# Patient Record
Sex: Male | Born: 1965 | Race: Black or African American | Hispanic: No | Marital: Single | Smoking: Never smoker
Health system: Southern US, Community
[De-identification: ages and names within clinical notes are randomized; demographics above are authoritative.]

## PROBLEM LIST (undated history)

## (undated) DIAGNOSIS — I2510 Atherosclerotic heart disease of native coronary artery without angina pectoris: Secondary | ICD-10-CM

## (undated) DIAGNOSIS — I10 Essential (primary) hypertension: Secondary | ICD-10-CM

## (undated) DIAGNOSIS — J45909 Unspecified asthma, uncomplicated: Secondary | ICD-10-CM

---

## 2018-05-17 ENCOUNTER — Encounter (HOSPITAL_COMMUNITY): Payer: Self-pay

## 2018-05-17 ENCOUNTER — Other Ambulatory Visit: Payer: Self-pay

## 2018-05-17 ENCOUNTER — Emergency Department (HOSPITAL_COMMUNITY): Payer: Self-pay

## 2018-05-17 ENCOUNTER — Inpatient Hospital Stay (HOSPITAL_COMMUNITY)
Admission: EM | Admit: 2018-05-17 | Discharge: 2018-05-19 | DRG: 247 | Disposition: A | Discharge status: Home / Self Care | Payer: Self-pay | Attending: Cardiology | Admitting: Cardiology

## 2018-05-17 DIAGNOSIS — R079 Chest pain, unspecified: Secondary | ICD-10-CM | POA: Diagnosis present

## 2018-05-17 DIAGNOSIS — I2721 Secondary pulmonary arterial hypertension: Secondary | ICD-10-CM | POA: Diagnosis present

## 2018-05-17 DIAGNOSIS — Z8349 Family history of other endocrine, nutritional and metabolic diseases: Secondary | ICD-10-CM

## 2018-05-17 DIAGNOSIS — I10 Essential (primary) hypertension: Secondary | ICD-10-CM

## 2018-05-17 DIAGNOSIS — Z8249 Family history of ischemic heart disease and other diseases of the circulatory system: Secondary | ICD-10-CM

## 2018-05-17 DIAGNOSIS — Z79899 Other long term (current) drug therapy: Secondary | ICD-10-CM

## 2018-05-17 DIAGNOSIS — I119 Hypertensive heart disease without heart failure: Secondary | ICD-10-CM | POA: Diagnosis present

## 2018-05-17 DIAGNOSIS — E785 Hyperlipidemia, unspecified: Secondary | ICD-10-CM

## 2018-05-17 DIAGNOSIS — I4581 Long QT syndrome: Secondary | ICD-10-CM | POA: Diagnosis present

## 2018-05-17 DIAGNOSIS — E876 Hypokalemia: Secondary | ICD-10-CM | POA: Diagnosis not present

## 2018-05-17 DIAGNOSIS — I214 Non-ST elevation (NSTEMI) myocardial infarction: Principal | ICD-10-CM

## 2018-05-17 DIAGNOSIS — I493 Ventricular premature depolarization: Secondary | ICD-10-CM | POA: Diagnosis present

## 2018-05-17 DIAGNOSIS — Z955 Presence of coronary angioplasty implant and graft: Secondary | ICD-10-CM

## 2018-05-17 DIAGNOSIS — I25110 Atherosclerotic heart disease of native coronary artery with unstable angina pectoris: Secondary | ICD-10-CM | POA: Diagnosis present

## 2018-05-17 DIAGNOSIS — R739 Hyperglycemia, unspecified: Secondary | ICD-10-CM | POA: Diagnosis present

## 2018-05-17 DIAGNOSIS — J45909 Unspecified asthma, uncomplicated: Secondary | ICD-10-CM | POA: Diagnosis present

## 2018-05-17 DIAGNOSIS — Z7982 Long term (current) use of aspirin: Secondary | ICD-10-CM

## 2018-05-17 HISTORY — DX: Atherosclerotic heart disease of native coronary artery without angina pectoris: I25.10

## 2018-05-17 HISTORY — DX: Essential (primary) hypertension: I10

## 2018-05-17 HISTORY — DX: Unspecified asthma, uncomplicated: J45.909

## 2018-05-17 LAB — CBC
HEMATOCRIT: 46.1 % (ref 39.0–52.0)
HEMATOCRIT: 42.1 % (ref 39.0–52.0)
HEMOGLOBIN: 14.2 g/dL (ref 13.0–17.0)
Hemoglobin: 15.5 g/dL (ref 13.0–17.0)
MCH: 28.7 pg (ref 26.0–34.0)
MCH: 28.7 pg (ref 26.0–34.0)
MCHC: 33.6 g/dL (ref 30.0–36.0)
MCHC: 33.7 g/dL (ref 30.0–36.0)
MCV: 85.4 fL (ref 78.0–100.0)
MCV: 85.2 fL (ref 78.0–100.0)
Platelets: 189 10*3/uL (ref 150–400)
Platelets: 188 10*3/uL (ref 150–400)
RBC: 5.4 MIL/uL (ref 4.22–5.81)
RBC: 4.94 MIL/uL (ref 4.22–5.81)
RDW: 13 % (ref 11.5–15.5)
RDW: 13 % (ref 11.5–15.5)
WBC: 4.2 10*3/uL (ref 4.0–10.5)
WBC: 4.6 10*3/uL (ref 4.0–10.5)

## 2018-05-17 LAB — BASIC METABOLIC PANEL
Anion gap: 10 (ref 5–15)
BUN: 15 mg/dL (ref 6–20)
CHLORIDE: 106 mmol/L (ref 98–111)
CO2: 26 mmol/L (ref 22–32)
CREATININE: 1.1 mg/dL (ref 0.61–1.24)
Calcium: 9.3 mg/dL (ref 8.9–10.3)
GFR calc Af Amer: >60 mL/min (ref >60)
GFR calc non Af Amer: >60 mL/min (ref >60)
Glucose, Bld: 103 mg/dL — ABNORMAL HIGH (ref 70–99)
POTASSIUM: 3.9 mmol/L (ref 3.5–5.1)
SODIUM: 142 mmol/L (ref 135–145)

## 2018-05-17 LAB — CREATININE, SERUM
CREATININE: 1.16 mg/dL (ref 0.61–1.24)
GFR calc Af Amer: >60 mL/min (ref >60)

## 2018-05-17 LAB — PHOSPHORUS: PHOSPHORUS: 3.2 mg/dL (ref 2.5–4.6)

## 2018-05-17 LAB — POCT I-STAT TROPONIN I: TROPONIN I, POC: 0.38 ng/mL — AB (ref 0.00–0.08)

## 2018-05-17 LAB — MAGNESIUM: Magnesium: 2.3 mg/dL (ref 1.7–2.4)

## 2018-05-17 LAB — TROPONIN I
TROPONIN I: 6.59 ng/mL — AB (ref <0.03)
TROPONIN I: 6.96 ng/mL — AB (ref <0.03)

## 2018-05-17 LAB — TSH: TSH: 0.365 u[IU]/mL (ref 0.350–4.500)

## 2018-05-17 LAB — D-DIMER, QUANTITATIVE (NOT AT ARMC): D DIMER QUANT: 0.67 ug{FEU}/mL — AB (ref 0.00–0.50)

## 2018-05-17 MED ORDER — NITROGLYCERIN 0.4 MG SL SUBL: 0.4000 mg | SUBLINGUAL_TABLET | SUBLINGUAL | Status: DC | PRN

## 2018-05-17 MED ORDER — MORPHINE SULFATE (PF) 2 MG/ML IV SOLN
2.0000 mg | INTRAVENOUS | Status: DC | PRN
  Administered 2018-05-17 – 2018-05-18 (×2): 2 mg via INTRAVENOUS
  Filled 2018-05-17 (×2): qty 1

## 2018-05-17 MED ORDER — ALPRAZOLAM 0.25 MG PO TABS: 0.2500 mg | ORAL_TABLET | Freq: Two times a day (BID) | ORAL | Status: DC | PRN

## 2018-05-17 MED ORDER — METOPROLOL TARTRATE 25 MG PO TABS
25.0000 mg | ORAL_TABLET | Freq: Two times a day (BID) | ORAL | Status: DC
  Administered 2018-05-17 – 2018-05-18 (×3): 25 mg via ORAL
  Filled 2018-05-17 (×3): qty 1

## 2018-05-17 MED ORDER — NITROGLYCERIN 2 % TD OINT
1.0000 [in_us] | TOPICAL_OINTMENT | Freq: Once | TRANSDERMAL | Status: AC
  Administered 2018-05-17: 1 [in_us] via TOPICAL
  Filled 2018-05-17: qty 1

## 2018-05-17 MED ORDER — ASPIRIN 325 MG PO TABS
650.0000 mg | ORAL_TABLET | Freq: Every day | ORAL | Status: DC
  Administered 2018-05-17: 650 mg via ORAL
  Filled 2018-05-17: qty 2

## 2018-05-17 MED ORDER — IOPAMIDOL (ISOVUE-370) INJECTION 76%
INTRAVENOUS | Status: AC
  Administered 2018-05-17: 100 mL
  Filled 2018-05-17: qty 100

## 2018-05-17 MED ORDER — HYDRALAZINE HCL 20 MG/ML IJ SOLN: 10.0000 mg | Freq: Four times a day (QID) | INTRAMUSCULAR | Status: DC | PRN

## 2018-05-17 MED ORDER — ACETAMINOPHEN 325 MG PO TABS
650.0000 mg | ORAL_TABLET | ORAL | Status: DC | PRN
  Administered 2018-05-18 (×2): 650 mg via ORAL
  Filled 2018-05-17 (×2): qty 2

## 2018-05-17 MED ORDER — HEPARIN SODIUM (PORCINE) 5000 UNIT/ML IJ SOLN
5000.0000 [IU] | Freq: Three times a day (TID) | INTRAMUSCULAR | Status: DC
  Administered 2018-05-17 – 2018-05-18 (×2): 5000 [IU] via SUBCUTANEOUS
  Filled 2018-05-17 (×2): qty 1

## 2018-05-17 MED ORDER — MORPHINE SULFATE (PF) 2 MG/ML IV SOLN
2.0000 mg | Freq: Once | INTRAVENOUS | Status: AC
  Administered 2018-05-17: 2 mg via INTRAVENOUS
  Filled 2018-05-17: qty 1

## 2018-05-17 MED ORDER — GI COCKTAIL ~~LOC~~: 30.0000 mL | Freq: Four times a day (QID) | ORAL | Status: DC | PRN

## 2018-05-17 MED ORDER — ONDANSETRON HCL 4 MG/2ML IJ SOLN: 4.0000 mg | Freq: Four times a day (QID) | INTRAMUSCULAR | Status: DC | PRN

## 2018-05-17 NOTE — ED Provider Notes (Signed)
[facility]-EMERGENCY DEPT Provider Note   CSN: [id number] Arrival date & time: 05/17/18  1125     History   Chief Complaint Chief Complaint  Patient presents with  . Chest Pain    HPI Jason ButtnerRobert Ewing is a 52 y.o. male.  52 year old male with prior history of asthma presents with complaint of left-sided chest discomfort.  Patient reports chest pain with onset this morning around 7 AM.  Patient's pain was intermittent over the course of the morning.  He denies any prior history of CAD or prior episodes of similar chest pain.  He denies any prior history of hypertension.  Patient feels improved upon his initial evaluation in the ED.  He denies associated back pain, shortness of breath, nausea, vomiting, and/or diaphoresis.  The history is provided by the patient.  Chest Pain   This is a new problem. The current episode started 3 to 5 hours ago. The problem occurs rarely. The problem has been resolved. The pain is present in the lateral region. The pain is mild. The quality of the pain is described as pressure-like. The pain does not radiate. Duration of episode(s) is 4 hours. Pertinent negatives include no abdominal pain, no fever, no malaise/fatigue, no palpitations, no PND and no shortness of breath.    Past Medical History:  Diagnosis Date  . Hypertension     Patient Active Problem List   Diagnosis Date Noted  . Chest pain 05/17/2018        Home Medications    Prior to Admission medications   Medication Sig Start Date End Date Taking? Authorizing Provider  aspirin 325 MG tablet Take 650 mg by mouth every 6 (six) hours as needed for mild pain or moderate pain.   Yes [provider]    Family History History reviewed. No pertinent family history.  Social History Social History   Tobacco Use  . Smoking status: Never Smoker  . Smokeless tobacco: Never Used  Substance Use Topics  . Alcohol use: Not on file  . Drug use: Not on file      Allergies   Patient has no known allergies.   Review of Systems Review of Systems  Constitutional: Negative for fever and malaise/fatigue.  Respiratory: Negative for shortness of breath.   Cardiovascular: Positive for chest pain. Negative for palpitations and PND.  Gastrointestinal: Negative for abdominal pain.  All other systems reviewed and are negative.    Physical Exam Updated Vital Signs BP 130/83   Pulse 69   Temp 98.1 F (36.7 C)   Resp 16   SpO2 97%   Physical Exam  Constitutional: He is oriented to person, place, and time. He appears well-developed and well-nourished. No distress.  HENT:  Head: Normocephalic and atraumatic.  Mouth/Throat: Oropharynx is clear and moist.  Eyes: Pupils are equal, round, and reactive to light. Conjunctivae and EOM are normal.  Neck: Normal range of motion. Neck supple.  Cardiovascular: Normal rate, regular rhythm and normal heart sounds.  Pulmonary/Chest: Effort normal and breath sounds normal. No respiratory distress.  Abdominal: Soft. He exhibits no distension. There is no tenderness.  Musculoskeletal: Normal range of motion. He exhibits no edema or deformity.  Neurological: He is alert and oriented to person, place, and time.  Skin: Skin is warm and dry.  Psychiatric: He has a normal mood and affect.  Nursing note and vitals reviewed.    ED Treatments / Results  Labs (all labs ordered are listed, but only abnormal results are  displayed) Labs Reviewed  BASIC METABOLIC PANEL - Abnormal; Notable for the following components:      Result Value   Glucose, Bld 103 (*)    All other components within normal limits  D-DIMER, QUANTITATIVE (NOT AT [facility]) - Abnormal; Notable for the following components:   D-Dimer, Quant 0.67 (*)    All other components within normal limits  POCT I-STAT TROPONIN I - Abnormal; Notable for the following components:   Troponin i, poc 0.38 (*)    All other components within normal limits  CBC   I-STAT TROPONIN, ED    EKG EKG Interpretation  Date/Time:  Sunday May 17 2018 12:03:13 EDT Ventricular Rate:  69 PR Interval:    QRS Duration: 92 QT Interval:  394 QTC Calculation: 423 R Axis:   -13 Text Interpretation:  Sinus rhythm Abnormal R-wave progression, early transition LVH with secondary repolarization abnormality Confirmed by Kristine RoyalMessick, Haneefah Venturini [phone]([id number]) on 05/17/2018 12:09:00 PM   Radiology Dg Chest 2 View  Result Date: 05/17/2018 CLINICAL DATA:  Left-sided chest pain.  Hypertension.  Asthma. EXAM: CHEST - 2 VIEW COMPARISON:  None. FINDINGS: Midline trachea. Normal heart size. Tortuous thoracic aorta. No pleural effusion or pneumothorax. Clear lungs. Numerous leads and wires project over the chest. IMPRESSION: No acute cardiopulmonary disease. Electronically Signed   By: Jeronimo GreavesKyle  Talbot M.D.   On: 05/17/2018 13:08   Ct Angio Chest Pe W And/or Wo Contrast  Result Date: 05/17/2018 CLINICAL DATA:  Chest pain EXAM: CT ANGIOGRAPHY CHEST WITH CONTRAST TECHNIQUE: Multidetector CT imaging of the chest was performed using the standard protocol during bolus administration of intravenous contrast. Multiplanar CT image reconstructions and MIPs were obtained to evaluate the vascular anatomy. CONTRAST:  95 mL ISOVUE-370 IOPAMIDOL (ISOVUE-370) INJECTION 76% COMPARISON:  Chest radiograph May 17, 2018 FINDINGS: Cardiovascular: There is no demonstrable pulmonary embolus. There is no thoracic aortic aneurysm or dissection. The visualized great vessels appear normal. There is no pericardial effusion or pericardial thickening. The main pulmonary outflow tract measures 3.2 cm, prominent. Mediastinum/Nodes: Visualized thyroid appears unremarkable. There is no appreciable thoracic adenopathy. No esophageal lesions are evident. Lungs/Pleura: The lungs are clear. No pleural effusion or pleural thickening evident. Upper Abdomen: Visualized upper abdominal structures appear unremarkable. Musculoskeletal: There  are no blastic or lytic bone lesions. No chest wall lesions are evident. Review of the MIP images confirms the above findings. IMPRESSION: 1. No demonstrable pulmonary embolus. No thoracic aortic aneurysm or dissection. 2. Prominence the main pulmonary outflow tract, a finding felt to be indicative of a degree of pulmonary arterial hypertension. 3.  No edema or consolidation. 4.  No evident thoracic adenopathy. Electronically Signed   By: Bretta BangWilliam  Woodruff III M.D.   On: 05/17/2018 14:42    Procedures Procedures (including critical care time)  Medications Ordered in ED Medications  nitroGLYCERIN (NITROSTAT) SL tablet 0.4 mg (has no administration in time range)  nitroGLYCERIN (NITROGLYN) 2 % ointment 1 inch (1 inch Topical Given 05/17/18 1328)  morphine 2 MG/ML injection 2 mg (2 mg Intravenous Given 05/17/18 1328)  iopamidol (ISOVUE-370) 76 % injection (100 mLs  Contrast Given 05/17/18 1410)     Initial Impression / Assessment and Plan / ED Course  I have reviewed the triage vital signs and the nursing notes.  Pertinent labs & imaging results that were available during my care of the patient were reviewed by me and considered in my medical decision making (see chart for details).     MDM  Screen complete  Patient is  presenting for evaluation of chest pain.  Symptoms are improved at time of ED evaluation.  Initial EKG is without evidence of acute ischemia. Initial Trop and Ddimer are slightly elevated.   However, CTA is negative for PE.  ASA (325mg ) taken prior to arrival. BP improved with nitropaste.  Patient is pain free at time of admission.   Patient will require admission for ACS work-up.  Hospitalist service is aware of case and will evaluate for admission.  Final Clinical Impressions(s) / ED Diagnoses   Final diagnoses:  Chest pain, unspecified type  Hypertension, unspecified type    ED Discharge Orders    None       Wynetta Fines, MD 05/17/18 1558

## 2018-05-17 NOTE — ED Notes (Signed)
ED provider Messick made aware patient has critical Troponin of 0.38

## 2018-05-17 NOTE — ED Notes (Signed)
ED TO INPATIENT HANDOFF REPORT  Name/Age/Gender Jason Ewing 52 y.o. male  Code Status   Home/SNF/Other Home  Chief Complaint chest pain,left shoulder pain  Level of Care/Admitting Diagnosis ED Disposition    ED Disposition Condition Comment   Admit  Hospital Area: [facility] [[id number]]  Level of Care: Telemetry [5]  Admit to tele based on following criteria: Monitor QTC interval  Admit to tele based on following criteria: Monitor for Ischemic changes  Diagnosis: Chest pain [810175]  Admitting Physician: [facility], [place] [[id number]]  Attending Physician: Raiford Noble LATIF [[id number]]  PT Class (Do Not Modify): Observation [104]  PT Acc Code (Do Not Modify): Observation [10022]       Medical History Past Medical History:  Diagnosis Date  . Hypertension     Allergies No Known Allergies  IV Location/Drains/Wounds Patient Lines/Drains/Airways Status   Active Line/Drains/Airways    Name:   Placement date:   Placement time:   Site:   Days:   Peripheral IV 05/17/18 Left Antecubital   05/17/18    1216    Antecubital   less than 1          Labs/Imaging Results for orders placed or performed during the hospital encounter of 05/17/18 (from the past 48 hour(s))  Basic metabolic panel     Status: Abnormal   Collection Time: 05/17/18 11:57 AM  Result Value Ref Range   Sodium 142 135 - 145 mmol/L   Potassium 3.9 3.5 - 5.1 mmol/L   Chloride 106 98 - 111 mmol/L   CO2 26 22 - 32 mmol/L   Glucose, Bld 103 (H) 70 - 99 mg/dL   BUN 15 6 - 20 mg/dL   Creatinine, Ser 1.10 0.61 - 1.24 mg/dL   Calcium 9.3 8.9 - 10.3 mg/dL   GFR calc non Af Amer >60 >60 mL/min   GFR calc Af Amer >60 >60 mL/min    Comment: (NOTE) The eGFR has been calculated using the CKD EPI equation. This calculation has not been validated in all clinical situations. eGFR's persistently <60 mL/min signify possible Chronic Kidney Disease.    Anion gap 10 5 - 15    Comment: Performed  at [facility], [place] [address] [place], [place] [address]  CBC     Status: None   Collection Time: 05/17/18 11:57 AM  Result Value Ref Range   WBC 4.2 4.0 - 10.5 K/uL   RBC 5.40 4.22 - 5.81 MIL/uL   Hemoglobin 15.5 13.0 - 17.0 g/dL   HCT 46.1 39.0 - 52.0 %   MCV 85.4 78.0 - 100.0 fL   MCH 28.7 26.0 - 34.0 pg   MCHC 33.6 30.0 - 36.0 g/dL   RDW 13.0 11.5 - 15.5 %   Platelets 189 150 - 400 K/uL    Comment: Performed at [facility], [place] [address] [place],  [address]  POCT i-Stat troponin I     Status: Abnormal   Collection Time: 05/17/18 12:21 PM  Result Value Ref Range   Troponin i, poc 0.38 (HH) 0.00 - 0.08 ng/mL   Comment NOTIFIED PHYSICIAN    Comment 3            Comment: Due to the release kinetics of cTnI, a negative result within the first hours of the onset of symptoms does not rule out myocardial infarction with certainty. If myocardial infarction is still suspected, repeat the test at appropriate intervals.   D-dimer, quantitative (not at [facility])  Status: Abnormal   Collection Time: 05/17/18  1:27 PM  Result Value Ref Range   D-Dimer, Quant 0.67 (H) 0.00 - 0.50 ug/mL-FEU    Comment: (NOTE) At the manufacturer cut-off of 0.50 ug/mL FEU, this assay has been documented to exclude PE with a sensitivity and negative predictive value of 97 to 99%.  At this time, this assay has not been approved by the FDA to exclude DVT/VTE. Results should be correlated with clinical presentation. Performed at [facility], [place] [address] [place], [place] [address]    Dg Chest 2 View  Result Date: 05/17/2018 CLINICAL DATA:  Left-sided chest pain.  Hypertension.  Asthma. EXAM: CHEST - 2 VIEW COMPARISON:  None. FINDINGS: Midline trachea. Normal heart size. Tortuous thoracic aorta. No pleural effusion or pneumothorax. Clear lungs. Numerous leads and wires project over the chest. IMPRESSION: No acute cardiopulmonary disease.  Electronically Signed   By: Abigail Miyamoto M.D.   On: 05/17/2018 13:08   Ct Angio Chest Pe W And/or Wo Contrast  Result Date: 05/17/2018 CLINICAL DATA:  Chest pain EXAM: CT ANGIOGRAPHY CHEST WITH CONTRAST TECHNIQUE: Multidetector CT imaging of the chest was performed using the standard protocol during bolus administration of intravenous contrast. Multiplanar CT image reconstructions and MIPs were obtained to evaluate the vascular anatomy. CONTRAST:  95 mL ISOVUE-370 IOPAMIDOL (ISOVUE-370) INJECTION 76% COMPARISON:  Chest radiograph May 17, 2018 FINDINGS: Cardiovascular: There is no demonstrable pulmonary embolus. There is no thoracic aortic aneurysm or dissection. The visualized great vessels appear normal. There is no pericardial effusion or pericardial thickening. The main pulmonary outflow tract measures 3.2 cm, prominent. Mediastinum/Nodes: Visualized thyroid appears unremarkable. There is no appreciable thoracic adenopathy. No esophageal lesions are evident. Lungs/Pleura: The lungs are clear. No pleural effusion or pleural thickening evident. Upper Abdomen: Visualized upper abdominal structures appear unremarkable. Musculoskeletal: There are no blastic or lytic bone lesions. No chest wall lesions are evident. Review of the MIP images confirms the above findings. IMPRESSION: 1. No demonstrable pulmonary embolus. No thoracic aortic aneurysm or dissection. 2. Prominence the main pulmonary outflow tract, a finding felt to be indicative of a degree of pulmonary arterial hypertension. 3.  No edema or consolidation. 4.  No evident thoracic adenopathy. Electronically Signed   By: Lowella Grip III M.D.   On: 05/17/2018 14:42    Pending Labs [employer] (From admission, onward)    Start     Ordered   Signed and Held  HIV antibody (Routine Testing)  Once,   R     Signed and Held   Signed and Held  Troponin I-serum (0, 3, 6 hours)  Now then every 3 hours,   TIMED     Signed and Held   Signed and  Held  CBC  (heparin)  Once,   R    Comments:  Baseline for heparin therapy IF NOT ALREADY DRAWN.  Notify MD if PLT < 100 K.    Signed and Held   Signed and Held  Creatinine, serum  (heparin)  Once,   R    Comments:  Baseline for heparin therapy IF NOT ALREADY DRAWN.    Signed and Held   Signed and Held  Lipid panel  Tomorrow morning,   R     Signed and Held   Signed and Held  Hemoglobin A1c  Tomorrow morning,   R     Signed and Held   Signed and Held  Magnesium  Add-on,   R  Signed and Held   Signed and Held  Phosphorus  Add-on,   R     Signed and Held   Signed and Held  TSH  Add-on,   R     Signed and Held          Vitals/Pain Today's Vitals   05/17/18 1505 05/17/18 1536 05/17/18 1600 05/17/18 1630  BP:  130/83 137/80 (!) 131/93  Pulse:  69    Resp:  16 20 (!) 21  Temp:      SpO2:  97% (!) 86% 94%  PainSc: 2        Isolation Precautions No active isolations  Medications Medications  nitroGLYCERIN (NITROSTAT) SL tablet 0.4 mg (has no administration in time range)  nitroGLYCERIN (NITROGLYN) 2 % ointment 1 inch (1 inch Topical Given 05/17/18 1328)  morphine 2 MG/ML injection 2 mg (2 mg Intravenous Given 05/17/18 1328)  iopamidol (ISOVUE-370) 76 % injection (100 mLs  Contrast Given 05/17/18 1410)    Mobility walks

## 2018-05-17 NOTE — H&P (Signed)
History and Physical    Jason Ewing QIW:[id number] DOB: 07-02-1966 DOA: 05/17/2018  PCP: Patient, No Pcp Per   Patient coming from: Home  Chief Complaint: Chest Pain and Left Arm Numbness  HPI: Jason Ewing is a 52 y.o. male with medical history significant for hypertension, asthma, who does not follow-up with physician and has not seen a physician in several years who presented to [place] [facility] emergency room with a chief complaint of chest pain.  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  Patient states that about 3 weeks ago he developed chest pain and he does not recall what he was doing when he developed his chest pain and states it was intermittent and stated that it went away in 30 minutes and states that patient tried to ignore it and went away.  He states that since then he has had intermittent chest pain on and off however this morning when he woke up he had significant amount of chest pain around 530 and went back to sleep and when he woke up at 730 this a.m. pain persisted so he was concerned.  Patient describes that the pain is worse with activity and better with rest.  He did not take any medications except a full dose aspirin.  Patient states that the pain was a 7 out of 10 that radiated into his left pectoral and midsternal area.  Patient also developed left arm numbness and states that he got hot and diaphoretic or sweaty.  Denied any nausea, vomiting lightheadedness, shortness of breath.  Patient states that the chest pain with mid sternal and that he put pressure on it.  When I palpated the patient's chest he states that the feeling he had was different and that the pain produced on palpation.  [facility] was called to admit this patient for chest pain.  ED Course: ED had basic blood work done with a chest x-ray and a CTA of the chest because of elevated d-dimer patient was given Nitropaste  and IV morphine with improvement in his symptoms.  The EDP consulted cardiology however cardiologist never paged the EDP back  Review of Systems: As per HPI otherwise 10 point review of systems negative.   Past Medical History:  Diagnosis Date  . Hypertension   -Asthma  SURGICAL HISTORY NO Surgeries per Patient  SOCIAL HISTORY  reports that he has never smoked. He has never used smokeless tobacco. His alcohol and drug histories are not on file. Drinks Building services engineer occasionally  NO ALLERGIES No Known Allergies  FAMILY HISTORY -Mother is 74 and has Lupus -Never met Father; Does not know of any early Hx of Heart attacsk  Prior to Admission medications   Medication Sig Start Date End Date Taking? Authorizing Provider  aspirin 325 MG tablet Take 650 mg by mouth every 6 (six) hours as needed for mild pain or moderate pain.   Yes [provider]   Physical Exam: Vitals:   05/17/18 1153 05/17/18 1329 05/17/18 1330  BP: (!) 168/116 (!) 140/102 (!) 133/101  Pulse: 85 72   Resp: 18 16 19   Temp: 98.1 F (36.7 C)    SpO2: 98% 98% 97%   Constitutional: WN/WD AAM in NAD and appears calm and comfortable Eyes: Lids and conjunctivae normal, sclerae anicteric  ENMT: External Ears, Nose appear normal. Grossly normal hearing. Mucous membranes are moist.   Neck: Appears  normal, supple, no cervical masses, normal ROM, no appreciable thyromegaly, no JVD Respiratory: Diminished to auscultation bilaterally, no wheezing, rales, rhonchi or crackles. Normal respiratory effort and patient is not tachypenic. No accessory muscle use.  Cardiovascular: RRR, no murmurs / rubs / gallops. S1 and S2 auscultated. No extremity edema.  Abdomen: Soft, non-tender, non-distended. No masses palpated. No appreciable hepatosplenomegaly. Bowel sounds positive x4.  GU: Deferred. Musculoskeletal: No clubbing / cyanosis of digits/nails. No joint deformity upper and lower extremities. Good ROM, no contractures.  Skin:  No rashes, lesions, ulcers on a limited skin evaluation. No induration; Warm and dry.  Neurologic: CN 2-12 grossly intact with no focal deficits.  Romberg sign and cerebellar reflexes not assessed.  Psychiatric: Normal judgment and insight. Alert and oriented x 3. Normal mood and appropriate affect.   Labs on Admission: I have personally reviewed following labs and imaging studies  CBC: Recent Labs  Lab 05/17/18 1157  WBC 4.2  HGB 15.5  HCT 46.1  MCV 85.4  PLT 993   Basic Metabolic Panel: Recent Labs  Lab 05/17/18 1157  NA 142  K 3.9  CL 106  CO2 26  GLUCOSE 103*  BUN 15  CREATININE 1.10  CALCIUM 9.3   GFR: CrCl cannot be calculated (Unknown ideal weight.). Liver Function Tests: No results for input(s): AST, ALT, ALKPHOS, BILITOT, PROT, ALBUMIN in the last 168 hours. No results for input(s): LIPASE, AMYLASE in the last 168 hours. No results for input(s): AMMONIA in the last 168 hours. Coagulation Profile: No results for input(s): INR, PROTIME in the last 168 hours. Cardiac Enzymes: No results for input(s): CKTOTAL, CKMB, CKMBINDEX, TROPONINI in the last 168 hours. BNP (last 3 results) No results for input(s): PROBNP in the last 8760 hours. HbA1C: No results for input(s): HGBA1C in the last 72 hours. CBG: No results for input(s): GLUCAP in the last 168 hours. Lipid Profile: No results for input(s): CHOL, HDL, LDLCALC, TRIG, CHOLHDL, LDLDIRECT in the last 72 hours. Thyroid Function Tests: No results for input(s): TSH, T4TOTAL, FREET4, T3FREE, THYROIDAB in the last 72 hours. Anemia Panel: No results for input(s): VITAMINB12, FOLATE, FERRITIN, TIBC, IRON, RETICCTPCT in the last 72 hours. Urine analysis: No results found for: COLORURINE, APPEARANCEUR, LABSPEC, PHURINE, GLUCOSEU, HGBUR, BILIRUBINUR, KETONESUR, PROTEINUR, UROBILINOGEN, NITRITE, LEUKOCYTESUR Sepsis Labs: !!!!!!!!!!!!!!!!!!!!!!!!!!!!!!!!!!!!!!!!!!!! @LABRCNTIP (procalcitonin:4,lacticidven:4) )No results  found for this or any previous visit (from the past 240 hour(s)).   Radiological Exams on Admission: Dg Chest 2 View  Result Date: 05/17/2018 CLINICAL DATA:  Left-sided chest pain.  Hypertension.  Asthma. EXAM: CHEST - 2 VIEW COMPARISON:  None. FINDINGS: Midline trachea. Normal heart size. Tortuous thoracic aorta. No pleural effusion or pneumothorax. Clear lungs. Numerous leads and wires project over the chest. IMPRESSION: No acute cardiopulmonary disease. Electronically Signed   By: Abigail Miyamoto M.D.   On: 05/17/2018 13:08   Ct Angio Chest Pe W And/or Wo Contrast  Result Date: 05/17/2018 CLINICAL DATA:  Chest pain EXAM: CT ANGIOGRAPHY CHEST WITH CONTRAST TECHNIQUE: Multidetector CT imaging of the chest was performed using the standard protocol during bolus administration of intravenous contrast. Multiplanar CT image reconstructions and MIPs were obtained to evaluate the vascular anatomy. CONTRAST:  95 mL ISOVUE-370 IOPAMIDOL (ISOVUE-370) INJECTION 76% COMPARISON:  Chest radiograph May 17, 2018 FINDINGS: Cardiovascular: There is no demonstrable pulmonary embolus. There is no thoracic aortic aneurysm or dissection. The visualized great vessels appear normal. There is no pericardial effusion or pericardial thickening. The main pulmonary outflow tract measures 3.2 cm, prominent. Mediastinum/Nodes: Visualized  thyroid appears unremarkable. There is no appreciable thoracic adenopathy. No esophageal lesions are evident. Lungs/Pleura: The lungs are clear. No pleural effusion or pleural thickening evident. Upper Abdomen: Visualized upper abdominal structures appear unremarkable. Musculoskeletal: There are no blastic or lytic bone lesions. No chest wall lesions are evident. Review of the MIP images confirms the above findings. IMPRESSION: 1. No demonstrable pulmonary embolus. No thoracic aortic aneurysm or dissection. 2. Prominence the main pulmonary outflow tract, a finding felt to be indicative of a degree of  pulmonary arterial hypertension. 3.  No edema or consolidation. 4.  No evident thoracic adenopathy. Electronically Signed   By: Lowella Grip III M.D.   On: 05/17/2018 14:42    EKG: Independently reviewed. Showed Sinus Rhythm with Some ST Depression and Repolarization Abnormalities  Assessment/Plan Active Problems:   * No active hospital problems. 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 twice daily -Check TSH, magnesium, phosphorus, and lipid panel -Also check patient's hemoglobin A1c -Continue to monitor patient's chest pain  HTN -Continue with nitroglycerin 0.4 mill grams sublingual every 5 minutes as needed chest pain and started metoprolol 25 mg p.o. twice daily -Consider adding Amlodipine 5 mg po Daily if not improving or still elevated  -Start IV hydralazine for systolic blood pressure greater than 195 or diastolic blood pressure greater than 90  Hyperglycemia -Blood Sugar on Admission was 103 -Check HbA1c  Elevated D-Dimer -Was elevated at 0.67 on admission -CTA of the chest showed. No demonstrable pulmonary embolus. No thoracic aortic aneurysm or dissection. Prominence the main pulmonary outflow tract, a finding felt to be indicative of a degree of pulmonary arterial hypertension. No edema or consolidation.No evident thoracic adenopathy.  DVT prophylaxis: Heparin 5,000 units sq q8h Code Status: FULL CODE Family Communication: Discussed with family at bedside Disposition Plan: Anticipate D/C Home in the next 24-48 hours if medically stable Consults called: Cardiology by EDP; No call back so call in AM  and notify Cards Master via Epic Admission status: Obs Telemetry  Severity of Illness: The appropriate patient status for this patient is OBSERVATION. Observation status is judged to be reasonable and necessary in order to provide the required intensity of service to ensure the patient's safety. The patient's presenting symptoms, physical exam findings, and initial radiographic and laboratory data in the context of their medical condition is felt to place them at decreased risk for further clinical deterioration. Furthermore, it is anticipated that the patient will be medically stable for discharge from the hospital within 2 midnights of admission. The following factors support the patient status of observation.   " The patient's presenting symptoms include Chest Pain. " The physical exam findings include Chest Discomfort on palpation. " The initial radiographic and laboratory data are concerning for Elevated Troponin.  Kerney Elbe, D.O. [facility] Pager [phone]  If 7PM-7AM, please contact night-coverage [email or web address] Password [email or web address]  05/17/2018, 3:30 PM

## 2018-05-17 NOTE — ED Triage Notes (Signed)
He c/o left-sided chest pain radiating into left arm, which he noticed upon awakening this morning. His skin is normal, warm and dry and he is breathing normally. EKG performed at triage.

## 2018-05-18 ENCOUNTER — Encounter (HOSPITAL_COMMUNITY): Payer: Self-pay

## 2018-05-18 ENCOUNTER — Other Ambulatory Visit: Payer: Self-pay

## 2018-05-18 ENCOUNTER — Encounter (HOSPITAL_COMMUNITY)
Admission: EM | Disposition: A | Discharge status: Home / Self Care | Payer: Self-pay | Source: Home / Self Care | Attending: Internal Medicine

## 2018-05-18 ENCOUNTER — Inpatient Hospital Stay (HOSPITAL_COMMUNITY): Payer: Self-pay

## 2018-05-18 DIAGNOSIS — I10 Essential (primary) hypertension: Secondary | ICD-10-CM

## 2018-05-18 DIAGNOSIS — I214 Non-ST elevation (NSTEMI) myocardial infarction: Principal | ICD-10-CM

## 2018-05-18 DIAGNOSIS — E785 Hyperlipidemia, unspecified: Secondary | ICD-10-CM

## 2018-05-18 HISTORY — PX: LEFT HEART CATH AND CORONARY ANGIOGRAPHY: CATH118249

## 2018-05-18 HISTORY — PX: CORONARY STENT INTERVENTION: CATH118234

## 2018-05-18 LAB — PROTIME-INR
INR: 0.96
Prothrombin Time: 12.7 seconds (ref 11.4–15.2)

## 2018-05-18 LAB — LIPID PANEL
CHOLESTEROL: 214 mg/dL — AB (ref 0–200)
HDL: 44 mg/dL (ref >40)
LDL Cholesterol: 147 mg/dL — ABNORMAL HIGH (ref 0–99)
TRIGLYCERIDES: 116 mg/dL (ref <150)
Total CHOL/HDL Ratio: 4.9 RATIO
VLDL: 23 mg/dL (ref 0–40)

## 2018-05-18 LAB — HIV ANTIBODY (ROUTINE TESTING W REFLEX): HIV SCREEN 4TH GENERATION: NONREACTIVE

## 2018-05-18 LAB — TROPONIN I
Troponin I: 13.53 ng/mL (ref <0.03)
Troponin I: 8.22 ng/mL (ref <0.03)

## 2018-05-18 LAB — HEMOGLOBIN A1C
Hgb A1c MFr Bld: 5.6 % (ref 4.8–5.6)
Mean Plasma Glucose: 114.02 mg/dL

## 2018-05-18 LAB — POCT ACTIVATED CLOTTING TIME: Activated Clotting Time: 478 seconds

## 2018-05-18 SURGERY — LEFT HEART CATH AND CORONARY ANGIOGRAPHY
Anesthesia: LOCAL

## 2018-05-18 MED ORDER — SODIUM CHLORIDE 0.9 % IV SOLN: 250.0000 mL | INTRAVENOUS | Status: DC | PRN

## 2018-05-18 MED ORDER — VERAPAMIL HCL 2.5 MG/ML IV SOLN
INTRA_ARTERIAL | Status: DC | PRN
  Administered 2018-05-18 (×2): 5 mL via INTRA_ARTERIAL

## 2018-05-18 MED ORDER — HEPARIN (PORCINE) IN NACL 100-0.45 UNIT/ML-% IJ SOLN
1300.0000 [IU]/h | INTRAMUSCULAR | Status: DC
  Administered 2018-05-18: 1300 [IU]/h via INTRAVENOUS
  Filled 2018-05-18: qty 250

## 2018-05-18 MED ORDER — TICAGRELOR 90 MG PO TABS
ORAL_TABLET | ORAL | Status: AC
  Filled 2018-05-18: qty 2

## 2018-05-18 MED ORDER — HEART ATTACK BOUNCING BOOK
Freq: Once | Status: AC
  Administered 2018-05-19: 04:00:00
  Filled 2018-05-18: qty 1

## 2018-05-18 MED ORDER — ACETAMINOPHEN 325 MG PO TABS: 650.0000 mg | ORAL_TABLET | ORAL | Status: DC | PRN

## 2018-05-18 MED ORDER — SODIUM CHLORIDE 0.9 % IV SOLN: INTRAVENOUS | Status: AC

## 2018-05-18 MED ORDER — IOHEXOL 350 MG/ML SOLN
INTRAVENOUS | Status: DC | PRN
  Administered 2018-05-18: 160 mL

## 2018-05-18 MED ORDER — LIDOCAINE HCL (PF) 1 % IJ SOLN
INTRAMUSCULAR | Status: DC | PRN
  Administered 2018-05-18: 2 mL

## 2018-05-18 MED ORDER — SODIUM CHLORIDE 0.9 % WEIGHT BASED INFUSION: 3.0000 mL/kg/h | INTRAVENOUS | Status: DC

## 2018-05-18 MED ORDER — HEPARIN (PORCINE) IN NACL 1000-0.9 UT/500ML-% IV SOLN
INTRAVENOUS | Status: DC | PRN
  Administered 2018-05-18 (×2): 500 mL

## 2018-05-18 MED ORDER — THE SENSUOUS HEART BOOK
Freq: Once | Status: AC
  Administered 2018-05-19: 04:00:00
  Filled 2018-05-18: qty 1

## 2018-05-18 MED ORDER — HEPARIN SODIUM (PORCINE) 1000 UNIT/ML IJ SOLN
INTRAMUSCULAR | Status: DC | PRN
  Administered 2018-05-18: 5500 [IU] via INTRAVENOUS

## 2018-05-18 MED ORDER — SODIUM CHLORIDE 0.9% FLUSH: 3.0000 mL | Freq: Two times a day (BID) | INTRAVENOUS | Status: DC

## 2018-05-18 MED ORDER — MORPHINE SULFATE (PF) 10 MG/ML IV SOLN: 2.0000 mg | INTRAVENOUS | Status: DC | PRN

## 2018-05-18 MED ORDER — SODIUM CHLORIDE 0.9 % WEIGHT BASED INFUSION
1.0000 mL/kg/h | INTRAVENOUS | Status: DC
  Administered 2018-05-18: 1 mL/kg/h via INTRAVENOUS

## 2018-05-18 MED ORDER — BIVALIRUDIN TRIFLUOROACETATE 250 MG IV SOLR
INTRAVENOUS | Status: AC
  Filled 2018-05-18: qty 250

## 2018-05-18 MED ORDER — HEPARIN (PORCINE) IN NACL 1000-0.9 UT/500ML-% IV SOLN
INTRAVENOUS | Status: AC
  Filled 2018-05-18: qty 1000

## 2018-05-18 MED ORDER — LABETALOL HCL 5 MG/ML IV SOLN: 10.0000 mg | INTRAVENOUS | Status: AC | PRN

## 2018-05-18 MED ORDER — VERAPAMIL HCL 2.5 MG/ML IV SOLN
INTRAVENOUS | Status: AC
  Filled 2018-05-18: qty 2

## 2018-05-18 MED ORDER — SODIUM CHLORIDE 0.9 % IV SOLN
1.7500 mg/kg/h | INTRAVENOUS | Status: AC
  Administered 2018-05-18: 1.75 mg/kg/h via INTRAVENOUS
  Filled 2018-05-18 (×2): qty 250

## 2018-05-18 MED ORDER — SODIUM CHLORIDE 0.9% FLUSH
3.0000 mL | Freq: Two times a day (BID) | INTRAVENOUS | Status: DC
  Administered 2018-05-18: 3 mL via INTRAVENOUS

## 2018-05-18 MED ORDER — MIDAZOLAM HCL 2 MG/2ML IJ SOLN
INTRAMUSCULAR | Status: AC
  Filled 2018-05-18: qty 2

## 2018-05-18 MED ORDER — NITROGLYCERIN 2 % TD OINT
1.0000 [in_us] | TOPICAL_OINTMENT | Freq: Four times a day (QID) | TRANSDERMAL | Status: DC
  Administered 2018-05-18 (×2): 1 [in_us] via TOPICAL
  Filled 2018-05-18 (×2): qty 30

## 2018-05-18 MED ORDER — HEPARIN BOLUS VIA INFUSION
3000.0000 [IU] | Freq: Once | INTRAVENOUS | Status: AC
  Administered 2018-05-18: 3000 [IU] via INTRAVENOUS
  Filled 2018-05-18: qty 3000

## 2018-05-18 MED ORDER — LIDOCAINE HCL (PF) 1 % IJ SOLN
INTRAMUSCULAR | Status: AC
  Filled 2018-05-18: qty 30

## 2018-05-18 MED ORDER — ONDANSETRON HCL 4 MG/2ML IJ SOLN: 4.0000 mg | Freq: Four times a day (QID) | INTRAMUSCULAR | Status: DC | PRN

## 2018-05-18 MED ORDER — HYDRALAZINE HCL 20 MG/ML IJ SOLN: 5.0000 mg | INTRAMUSCULAR | Status: AC | PRN

## 2018-05-18 MED ORDER — FENTANYL CITRATE (PF) 100 MCG/2ML IJ SOLN
INTRAMUSCULAR | Status: DC | PRN
  Administered 2018-05-18: 25 ug via INTRAVENOUS

## 2018-05-18 MED ORDER — SODIUM CHLORIDE 0.9% FLUSH: 3.0000 mL | INTRAVENOUS | Status: DC | PRN

## 2018-05-18 MED ORDER — TICAGRELOR 90 MG PO TABS
90.0000 mg | ORAL_TABLET | Freq: Two times a day (BID) | ORAL | Status: DC
  Administered 2018-05-19 (×2): 90 mg via ORAL
  Filled 2018-05-18 (×2): qty 1

## 2018-05-18 MED ORDER — ASPIRIN EC 81 MG PO TBEC
81.0000 mg | DELAYED_RELEASE_TABLET | Freq: Every day | ORAL | Status: DC
  Administered 2018-05-18: 81 mg via ORAL
  Filled 2018-05-18: qty 1

## 2018-05-18 MED ORDER — MIDAZOLAM HCL 2 MG/2ML IJ SOLN
INTRAMUSCULAR | Status: DC | PRN
  Administered 2018-05-18: 1 mg via INTRAVENOUS

## 2018-05-18 MED ORDER — TRAMADOL HCL 50 MG PO TABS: 50.0000 mg | ORAL_TABLET | Freq: Four times a day (QID) | ORAL | Status: DC | PRN

## 2018-05-18 MED ORDER — ASPIRIN 81 MG PO CHEW: 81.0000 mg | CHEWABLE_TABLET | ORAL | Status: DC

## 2018-05-18 MED ORDER — TICAGRELOR 90 MG PO TABS
ORAL_TABLET | ORAL | Status: DC | PRN
  Administered 2018-05-18: 180 mg via ORAL

## 2018-05-18 MED ORDER — BIVALIRUDIN BOLUS VIA INFUSION - CUPID
INTRAVENOUS | Status: DC | PRN
  Administered 2018-05-18: 84.225 mg via INTRAVENOUS

## 2018-05-18 MED ORDER — ASPIRIN 81 MG PO CHEW
81.0000 mg | CHEWABLE_TABLET | Freq: Every day | ORAL | Status: DC
  Administered 2018-05-19: 81 mg via ORAL
  Filled 2018-05-18: qty 1

## 2018-05-18 MED ORDER — ATORVASTATIN CALCIUM 80 MG PO TABS
80.0000 mg | ORAL_TABLET | Freq: Every day | ORAL | Status: DC
  Administered 2018-05-18: 80 mg via ORAL
  Filled 2018-05-18: qty 1

## 2018-05-18 MED ORDER — SODIUM CHLORIDE 0.9 % IV SOLN
INTRAVENOUS | Status: AC | PRN
  Administered 2018-05-18: 1.75 mg/kg/h via INTRAVENOUS
  Administered 2018-05-18: 1.75 mg/kg/h

## 2018-05-18 MED ORDER — FENTANYL CITRATE (PF) 100 MCG/2ML IJ SOLN
INTRAMUSCULAR | Status: AC
  Filled 2018-05-18: qty 2

## 2018-05-18 MED ORDER — HEPARIN SODIUM (PORCINE) 1000 UNIT/ML IJ SOLN
INTRAMUSCULAR | Status: AC
  Filled 2018-05-18: qty 1

## 2018-05-18 MED ORDER — ANGIOPLASTY BOOK
Freq: Once | Status: AC
  Administered 2018-05-19: 04:00:00
  Filled 2018-05-18: qty 1

## 2018-05-18 SURGICAL SUPPLY — 21 items
BALLN SAPPHIRE 2.0X12 (BALLOONS) ×2
BALLN ~~LOC~~ EMERGE MR 3.25X15 (BALLOONS) ×2
BALLOON SAPPHIRE 2.0X12 (BALLOONS) ×1 IMPLANT
BALLOON ~~LOC~~ EMERGE MR 3.25X15 (BALLOONS) ×1 IMPLANT
CATH EXTRAC PRONTO 5.5F 138CM (CATHETERS) ×2 IMPLANT
CATH INFINITI 5FR ANG PIGTAIL (CATHETERS) ×2 IMPLANT
CATH OPTITORQUE TIG 4.0 5F (CATHETERS) ×2 IMPLANT
CATH VISTA GUIDE 6FR XBLAD3.5 (CATHETERS) ×2 IMPLANT
DEVICE RAD COMP TR BAND LRG (VASCULAR PRODUCTS) ×2 IMPLANT
GLIDESHEATH SLEND A-KIT 6F 22G (SHEATH) ×2 IMPLANT
GUIDEWIRE INQWIRE 1.5J.035X260 (WIRE) ×1 IMPLANT
INQWIRE 1.5J .035X260CM (WIRE) ×2
KIT ENCORE 26 ADVANTAGE (KITS) ×2 IMPLANT
KIT HEART LEFT (KITS) ×2 IMPLANT
PACK CARDIAC CATHETERIZATION (CUSTOM PROCEDURE TRAY) ×2 IMPLANT
STENT SYNERGY DES 3X20 (Permanent Stent) ×2 IMPLANT
SYR MEDRAD MARK V 150ML (SYRINGE) ×2 IMPLANT
TRANSDUCER W/STOPCOCK (MISCELLANEOUS) ×2 IMPLANT
TUBING CIL FLEX 10 FLL-RA (TUBING) ×2 IMPLANT
WIRE ASAHI PROWATER 180CM (WIRE) ×2 IMPLANT
WIRE HI TORQ VERSACORE-J 145CM (WIRE) ×2 IMPLANT

## 2018-05-18 NOTE — Progress Notes (Signed)
Upon shift report was notified of Troponin value increasing to 13.53. Patient is A&O x4 with no CP. VS stable. Followed up with MD this AM and cardiology has been consulted. Will continue to monitor this patient closely.

## 2018-05-18 NOTE — Consult Note (Addendum)
Cardiology Consultation:   Patient ID: Jason Ewing; [id number]; 1966/08/07   Admit date: 05/17/2018 Date of Consult: 05/18/2018  Primary Care Provider: Patient, No Pcp Per Primary Cardiologist: Jason Millers, MD - NEW  Patient Profile:   Jason Ewing is a 52 y.o. male with a hx of hypertension and asthma who is being seen today for the evaluation of Non-STEMI at the request of Dr. Alvino Chapel.  History of Present Illness:   Jason Ewing is here from [place] moving his daughter here for college. Yesterday morning he awoke at 6:30 AM with left-sided chest sharp  pain/pressure with associated shortness of breath, but no diaphoresis, lightheadedness or nausea.  He says that this took him to his knees.  He was able to go back to sleep and awoke at about 730 feeling about the same.  He went from his hotel to his daughter's house to help remove furniture and continued to feel discomfort.  His daughter encouraged him to go to the hospital.  Once here his pain was relieved and has not returned.  He admits to having milder similar episodes over the last few weeks intermittently.  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  Jason Ewing denies a prior history of diagnosed hypertension, however he has not been to a doctor in many years.  He feels that he has been healthy.  He denies any smoking and he only drinks 1 scotch drink about every few weeks.  He does not know his father's health history.  He thinks his mother may have had a stents in the past but he is not sure.  He was told that he had an enlarged heart in his 52s.  EKG: Sinus rhythm, 69 bpm, Abnormal R-wave progression, early transition, LVH with secondary repolarization abnormality Troponins are elevated 0.38>>6.96>> 13.53 CXR: no acute cardiopulmonary disease D-dimer was elevated at 0.67 CT chest showed no PE, no thoracic aneurysm or dissection.  TSH was normal  at 0.365 A1c 5.6 SCr 1.10  Past Medical History:  Diagnosis Date  . Hypertension     History reviewed. No pertinent surgical history.   Home Medications:  Prior to Admission medications   Medication Sig Start Date End Date Taking? Authorizing Provider  aspirin 325 MG tablet Take 650 mg by mouth every 6 (six) hours as needed for mild pain or moderate pain.   Yes [provider]    Inpatient Medications: Scheduled Meds: . aspirin EC  81 mg Oral Daily  . atorvastatin  80 mg Oral q1800  . heparin  3,000 Units Intravenous Once  . metoprolol tartrate  25 mg Oral BID   Continuous Infusions: . heparin     PRN Meds: acetaminophen, ALPRAZolam, gi cocktail, hydrALAZINE, morphine injection, nitroGLYCERIN, ondansetron (ZOFRAN) IV  Allergies:   No Known Allergies  Social History:   Social History   Socioeconomic History  . Marital status: Single    Spouse name: Not on file  . Number of children: Not on file  . Years of education: Not on file  . Highest education level: Not on file  Occupational History  . Not on file  Social Needs  . Financial resource strain: Not hard at all  . Food insecurity:    Worry: Never true    Inability: Never true  . Transportation needs:    Medical: No    Non-medical: No  Tobacco Use  . Smoking status: Never Smoker  .  Smokeless tobacco: Never Used  Substance and Sexual Activity  . Alcohol use: Not on file  . Drug use: Not on file  . Sexual activity: Not on file  Lifestyle  . Physical activity:    Days per week: Patient refused    Minutes per session: Patient refused  . Stress: Patient refused  Relationships  . Social connections:    Talks on phone: Patient refused    Gets together: Patient refused    Attends religious service: Patient refused    Active member of club or organization: Patient refused    Attends meetings of clubs or organizations: Patient refused    Relationship status: Patient refused  . Intimate partner  violence:    Fear of current or ex partner: No    Emotionally abused: No    Physically abused: No    Forced sexual activity: No  Other Topics Concern  . Not on file  Social History Narrative  . Not on file    Family History:    Family History  Problem Relation Age of Onset  . Hypertension Mother   . CAD Mother        ? had stent placed  . Hypertension Sister      ROS:  Please see the history of present illness.   All other ROS reviewed and negative.     Physical Exam/Data:   Vitals:   05/17/18 1734 05/17/18 2035 05/18/18 0014 05/18/18 0530  BP: (!) 129/91 127/78 121/75 118/86  Pulse: 63 68 64 66  Resp: 18 12 16 12   Temp: 98.4 F (36.9 C) 98.4 F (36.9 C) 98.7 F (37.1 C) 98.2 F (36.8 C)  TempSrc: Oral Oral Oral Oral  SpO2: 99%  95% 100%  Weight: 112.3 kg     Height: 6\' 3"  (1.905 m)       Intake/Output Summary (Last 24 hours) at 05/18/2018 0820 Last data filed at 05/17/2018 2300 Gross per 24 hour  Intake -  Output 150 ml  Net -150 ml   Filed Weights   05/17/18 1734  Weight: 112.3 kg   Body mass index is 30.94 kg/m.  General:  Well nourished, well developed, in no acute distress HEENT: normal Lymph: no adenopathy Neck: no JVD Endocrine:  No thryomegaly Vascular: No carotid bruits; FA pulses 2+ bilaterally without bruits  Cardiac:  normal S1, S2; RRR; no murmur  Lungs:  clear to auscultation bilaterally, no wheezing, rhonchi or rales  Abd: soft, nontender, no hepatomegaly  Ext: no edema Musculoskeletal:  No deformities, BUE and BLE strength normal and equal Skin: warm and dry  Neuro:  CNs 2-12 intact, no focal abnormalities noted Psych:  Normal affect   EKG:  The EKG was personally reviewed and demonstrates:  Sinus rhythm, 69 bpm, Abnormal R-wave progression, early transition, LVH with secondary repolarization abnormality. This am deeper TWI, diffusely  Telemetry:  Telemetry was personally reviewed and demonstrates:  SR in the 60's-70's with occ  PVCs  Relevant CV Studies:  None  Laboratory Data:  Chemistry Recent Labs  Lab 05/17/18 1157 05/17/18 1855  NA 142  --   K 3.9  --   CL 106  --   CO2 26  --   GLUCOSE 103*  --   BUN 15  --   CREATININE 1.10 1.16  CALCIUM 9.3  --   GFRNONAA >60 >60  GFRAA >60 >60  ANIONGAP 10  --     No results for input(s): PROT, ALBUMIN, AST, ALT, ALKPHOS, BILITOT  in the last 168 hours. Hematology Recent Labs  Lab 05/17/18 1157 05/17/18 1855  WBC 4.2 4.6  RBC 5.40 4.94  HGB 15.5 14.2  HCT 46.1 42.1  MCV 85.4 85.2  MCH 28.7 28.7  MCHC 33.6 33.7  RDW 13.0 13.0  PLT 189 188   Cardiac Enzymes Recent Labs  Lab 05/17/18 1855 05/18/18 0013  TROPONINI 6.59*  6.96* 13.53*    Recent Labs  Lab 05/17/18 1221  TROPIPOC 0.38*    BNPNo results for input(s): BNP, PROBNP in the last 168 hours.  DDimer  Recent Labs  Lab 05/17/18 1327  DDIMER 0.67*    Radiology/Studies:  Dg Chest 2 View  Result Date: 05/17/2018 CLINICAL DATA:  Left-sided chest pain.  Hypertension.  Asthma. EXAM: CHEST - 2 VIEW COMPARISON:  None. FINDINGS: Midline trachea. Normal heart size. Tortuous thoracic aorta. No pleural effusion or pneumothorax. Clear lungs. Numerous leads and wires project over the chest. IMPRESSION: No acute cardiopulmonary disease. Electronically Signed   By: Jeronimo Greaves M.D.   On: 05/17/2018 13:08   Ct Angio Chest Pe W And/or Wo Contrast  Result Date: 05/17/2018 CLINICAL DATA:  Chest pain EXAM: CT ANGIOGRAPHY CHEST WITH CONTRAST TECHNIQUE: Multidetector CT imaging of the chest was performed using the standard protocol during bolus administration of intravenous contrast. Multiplanar CT image reconstructions and MIPs were obtained to evaluate the vascular anatomy. CONTRAST:  95 mL ISOVUE-370 IOPAMIDOL (ISOVUE-370) INJECTION 76% COMPARISON:  Chest radiograph May 17, 2018 FINDINGS: Cardiovascular: There is no demonstrable pulmonary embolus. There is no thoracic aortic aneurysm or  dissection. The visualized great vessels appear normal. There is no pericardial effusion or pericardial thickening. The main pulmonary outflow tract measures 3.2 cm, prominent. Mediastinum/Nodes: Visualized thyroid appears unremarkable. There is no appreciable thoracic adenopathy. No esophageal lesions are evident. Lungs/Pleura: The lungs are clear. No pleural effusion or pleural thickening evident. Upper Abdomen: Visualized upper abdominal structures appear unremarkable. Musculoskeletal: There are no blastic or lytic bone lesions. No chest wall lesions are evident. Review of the MIP images confirms the above findings. IMPRESSION: 1. No demonstrable pulmonary embolus. No thoracic aortic aneurysm or dissection. 2. Prominence the main pulmonary outflow tract, a finding felt to be indicative of a degree of pulmonary arterial hypertension. 3.  No edema or consolidation. 4.  No evident thoracic adenopathy. Electronically Signed   By: Bretta Bang III M.D.   On: 05/17/2018 14:42    Assessment and Plan:   Non-STEMI -No prior known cardiac history except for "enlarged heart".  -Typical chest discomfort with left arm numbness and shortness of breath. Currently chest pain free.  -EKG: Sinus rhythm, 69 bpm, Abnormal R-wave progression, early transition, LVH with secondary repolarization abnormality. Mild anterior ST Changes this am.  -Troponins are elevated 0.38>>6.96>> 13.53 -CXR: no acute cardiopulmonary disease -D-dimer was elevated at 0.67. CT chest showed no PE, no thoracic aneurysm or dissection.  -TSH was normal at 0.365 -A1c 5.6 -SCr 1.10 -BB started. Aspirin 325 mg given.  -Will start IV heparin, aspirin 81 mg. -He has NTG paste on, will continue this.  -Plan for cardiac catheterization with possible intervention considering his typical symptoms and elevated troponin.   Hypertension -On no meds at home, no recent medical care -Has been started on metoprolol 25 mg bid -BP initially elevated  at 168/116. Now well controlled.   Hyperlipidemia -LDL 147. Will start high intensity statin with atorvastatin 80 mg daily.   For questions or updates, please contact [facility] Please consult [email or web address] for contact  info under Cardiology/STEMI.   Signed, Jason BonJanine Hammond, NP  05/18/2018 8:20 AM  As above, patient seen and examined.  Briefly he is a 52 year old male who typically does not follow with physicians.  [place] daughter in for college.  Over the past 2 months he has had pain in his chest described as sharp with associated left arm numbness predominantly with exertion relieved with rest.  He awoke yesterday morning at 5:30 AM with pain that did not resolve.  He fell back asleep and the pain was persistent 2 hours later.  He eventually was convinced to come to the emergency room and his pain resolved last evening.  He is presently pain-free and cardiology asked to evaluate. Troponin is 13.53. ECG shows sinus rhythm with occasional PVC.  Left ventricular hypertrophy.  Anterior T wave inversion concerning for LAD lesion.  Prolonged QT interval. 1 non-ST elevation myocardial infarction-patient presents with symptoms classic for unstable angina/non-ST elevation myocardial infarction.  His troponin is elevated and his electrocardiogram suggest LAD lesion.  He is presently pain-free.  Plan to treat with aspirin, nitrates, heparin, metoprolol and statin.  Proceed with cardiac catheterization.  The risks and benefits including myocardial infarction, CVA and death discussed and he agrees to proceed.  2 probable hypertension-patient does not typically follow with a physician.  He has left ventricular hypertrophy on his ECG.  Continue present medications and advance regimen as needed to control blood pressure.  3 hyperlipidemia-again Lipitor 80 mg daily.  Check lipids and liver in 4 weeks.  Jason MillersBrian Sherill Wegener, MD

## 2018-05-18 NOTE — Interval H&P Note (Signed)
Cath Lab Visit (complete for each Cath Lab visit)  Clinical Evaluation Leading to the Procedure:   ACS: Yes.    Non-ACS:    Anginal Classification: CCS III  Anti-ischemic medical therapy: No Therapy  Non-Invasive Test Results: No non-invasive testing performed  Prior CABG: No previous CABG      History and Physical Interval Note:  05/18/2018 3:20 PM  Jason Maduroobert Ewing  has presented today for surgery, with the diagnosis of ns  The various methods of treatment have been discussed with the patient and family. After consideration of risks, benefits and other options for treatment, the patient has consented to  Procedure(s): LEFT HEART CATH AND CORONARY ANGIOGRAPHY (N/A) as a surgical intervention .  The patient's history has been reviewed, patient examined, no change in status, stable for surgery.  I have reviewed the patient's chart and labs.  Questions were answered to the patient's satisfaction.     Jason BattyJonathan Alby Schwabe

## 2018-05-18 NOTE — Progress Notes (Addendum)
PROGRESS NOTE    Jason Ewing  ZOX:[id number] DOB: 1966/02/20 DOA: 05/17/2018 PCP: Patient, No Pcp Per     Brief Narrative:  Jason Ewing is a 52 y.o. male with medical history significant for hypertension, asthma, who does not follow-up with physician and has not seen a physician in several years who presented to [facility] emergency room with a chief complaint of chest pain.  Patient states the chest pain started a few weeks ago. He does not recall what he was doing when he developed his chest pain and states it was intermittent and stated that it went away in 30 minutes.  He states that since then he has had intermittent chest pain on and off, however this morning when he woke up he had significant amount of chest pain around 530 and went back to sleep and when he woke up at 730 this a.m. pain persisted so he was concerned.  Patient describes that the pain is worse with activity and better with rest.  He did not take any medications except a full dose aspirin.  Patient states that the pain was a 7 out of 10 that radiated into his left pectoral and midsternal area.  Patient also developed left arm numbness and states that he got hot and diaphoretic or sweaty.  Denied any nausea, vomiting lightheadedness, shortness of breath.    New events last 24 hours / Subjective: Troponin increased to 13.53 overnight. He currently denies any chest pain or shortness of breath.   Assessment & Plan:   Principal Problem:   NSTEMI (non-ST elevated myocardial infarction) (HCC) Active Problems:   Chest pain   HTN (hypertension)   HLD (hyperlipidemia)  NSTEMI -Troponin increased overnight 13.53 -Cardiology consulted; planning heart cath -IV heparin -Topical nitro  -Asprin  -Echo ordered   HTN -Started on metoprolol  -BP stable this morning   HLD -Started on lipitor   DVT prophylaxis: IV heparin  Code Status: Full Family Communication: No family at bedside Disposition Plan: Pending heart  cath   Consultants:   Cardiology  Procedures:   None   Antimicrobials:  Anti-infectives (From admission, onward)   None       Objective: Vitals:   05/18/18 0014 05/18/18 0530 05/18/18 0851 05/18/18 0906  BP: 121/75 118/86  (!) 134/97  Pulse: 64 66  70  Resp: 16 12  18   Temp: 98.7 F (37.1 C) 98.2 F (36.8 C)  98.4 F (36.9 C)  TempSrc: Oral Oral  Oral  SpO2: 95% 100%  96%  Weight:   112.3 kg   Height:        Intake/Output Summary (Last 24 hours) at 05/18/2018 0921 Last data filed at 05/17/2018 2300 Gross per 24 hour  Intake -  Output 150 ml  Net -150 ml   Filed Weights   05/17/18 1734 05/18/18 0851  Weight: 112.3 kg 112.3 kg    Examination:  General exam: Appears calm and comfortable  Respiratory system: Clear to auscultation. Respiratory effort normal. Cardiovascular system: S1 & S2 heard, RRR. No JVD, murmurs, rubs, gallops or clicks. No pedal edema. Gastrointestinal system: Abdomen is nondistended, soft and nontender. No organomegaly or masses felt. Normal bowel sounds heard. Central nervous system: Alert and oriented. No focal neurological deficits. Extremities: Symmetric 5 x 5 power. Skin: No rashes, lesions or ulcers Psychiatry: Judgement and insight appear normal. Mood & affect appropriate.   Data Reviewed: I have personally reviewed following labs and imaging studies  CBC: Recent Labs  Lab 05/17/18 1157  05/17/18 1855  WBC 4.2 4.6  HGB 15.5 14.2  HCT 46.1 42.1  MCV 85.4 85.2  PLT 189 188   Basic Metabolic Panel: Recent Labs  Lab 05/17/18 1157 05/17/18 1855  NA 142  --   K 3.9  --   CL 106  --   CO2 26  --   GLUCOSE 103*  --   BUN 15  --   CREATININE 1.10 1.16  CALCIUM 9.3  --   MG  --  2.3  PHOS  --  3.2   GFR: Estimated Creatinine Clearance: 100.7 mL/min (by C-G formula based on SCr of 1.16 mg/dL). Liver Function Tests: No results for input(s): AST, ALT, ALKPHOS, BILITOT, PROT, ALBUMIN in the last 168 hours. No results for  input(s): LIPASE, AMYLASE in the last 168 hours. No results for input(s): AMMONIA in the last 168 hours. Coagulation Profile: No results for input(s): INR, PROTIME in the last 168 hours. Cardiac Enzymes: Recent Labs  Lab 05/17/18 1855 05/18/18 0013  TROPONINI 6.59*  6.96* 13.53*   BNP (last 3 results) No results for input(s): PROBNP in the last 8760 hours. HbA1C: Recent Labs    05/18/18 0013  HGBA1C 5.6   CBG: No results for input(s): GLUCAP in the last 168 hours. Lipid Profile: Recent Labs    05/18/18 0013  CHOL 214*  HDL 44  LDLCALC 147*  TRIG 116  CHOLHDL 4.9   Thyroid Function Tests: Recent Labs    05/17/18 1855  TSH 0.365   Anemia Panel: No results for input(s): VITAMINB12, FOLATE, FERRITIN, TIBC, IRON, RETICCTPCT in the last 72 hours. Sepsis Labs: No results for input(s): PROCALCITON, LATICACIDVEN in the last 168 hours.  No results found for this or any previous visit (from the past 240 hour(s)).     Radiology Studies: Dg Chest 2 View  Result Date: 05/17/2018 CLINICAL DATA:  Left-sided chest pain.  Hypertension.  Asthma. EXAM: CHEST - 2 VIEW COMPARISON:  None. FINDINGS: Midline trachea. Normal heart size. Tortuous thoracic aorta. No pleural effusion or pneumothorax. Clear lungs. Numerous leads and wires project over the chest. IMPRESSION: No acute cardiopulmonary disease. Electronically Signed   By: Jeronimo GreavesKyle  Talbot M.D.   On: 05/17/2018 13:08   Ct Angio Chest Pe W And/or Wo Contrast  Result Date: 05/17/2018 CLINICAL DATA:  Chest pain EXAM: CT ANGIOGRAPHY CHEST WITH CONTRAST TECHNIQUE: Multidetector CT imaging of the chest was performed using the standard protocol during bolus administration of intravenous contrast. Multiplanar CT image reconstructions and MIPs were obtained to evaluate the vascular anatomy. CONTRAST:  95 mL ISOVUE-370 IOPAMIDOL (ISOVUE-370) INJECTION 76% COMPARISON:  Chest radiograph May 17, 2018 FINDINGS: Cardiovascular: There is no  demonstrable pulmonary embolus. There is no thoracic aortic aneurysm or dissection. The visualized great vessels appear normal. There is no pericardial effusion or pericardial thickening. The main pulmonary outflow tract measures 3.2 cm, prominent. Mediastinum/Nodes: Visualized thyroid appears unremarkable. There is no appreciable thoracic adenopathy. No esophageal lesions are evident. Lungs/Pleura: The lungs are clear. No pleural effusion or pleural thickening evident. Upper Abdomen: Visualized upper abdominal structures appear unremarkable. Musculoskeletal: There are no blastic or lytic bone lesions. No chest wall lesions are evident. Review of the MIP images confirms the above findings. IMPRESSION: 1. No demonstrable pulmonary embolus. No thoracic aortic aneurysm or dissection. 2. Prominence the main pulmonary outflow tract, a finding felt to be indicative of a degree of pulmonary arterial hypertension. 3.  No edema or consolidation. 4.  No evident thoracic adenopathy. Electronically Signed  By: Bretta BangWilliam  Woodruff III M.D.   On: 05/17/2018 14:42      Scheduled Meds: . [START ON 05/19/2018] aspirin  81 mg Oral Pre-Cath  . aspirin EC  81 mg Oral Daily  . atorvastatin  80 mg Oral q1800  . metoprolol tartrate  25 mg Oral BID  . nitroGLYCERIN  1 inch Topical Q6H  . sodium chloride flush  3 mL Intravenous Q12H   Continuous Infusions: . sodium chloride    . sodium chloride     Followed by  . sodium chloride    . heparin 1,300 Units/hr (05/18/18 0844)     LOS: 0 days    Time spent: 40 minutes   Noralee StainJennifer Yu Peggs, DO [facility] [email or web address] Password [email or web address] 05/18/2018, 9:21 AM

## 2018-05-18 NOTE — Progress Notes (Signed)
ANTICOAGULATION CONSULT NOTE - Initial Consult  Pharmacy Consult for heparin drip Indication: chest pain/ACS  No Known Allergies  Patient Measurements: Height: 6\' 3"  (190.5 cm) Weight: 247 lb 9.2 oz (112.3 kg) IBW/kg (Calculated) : 84.5 Heparin Dosing Weight: 107 kg  Vital Signs: Temp: 98.4 F (36.9 C) (08/19 0906) Temp Source: Oral (08/19 0906) BP: 134/97 (08/19 0906) Pulse Rate: 70 (08/19 0906)  Labs: Recent Labs    05/17/18 1157 05/17/18 1855 05/18/18 0013 05/18/18 1029  HGB 15.5 14.2  --   --   HCT 46.1 42.1  --   --   PLT 189 188  --   --   LABPROT  --   --   --  12.7  INR  --   --   --  0.96  CREATININE 1.10 1.16  --   --   TROPONINI  --  6.59*  6.96* 13.53*  --     Estimated Creatinine Clearance: 100.7 mL/min (by C-G formula based on SCr of 1.16 mg/dL).   Medical History: Past Medical History:  Diagnosis Date  . Hypertension     Assessment: Patient admitted on 8/18 with chest pain and left arm numbness. PMH significant for HTN - has not seen physician in several years.  Troponin 13.5 this morning - pharmacy consulted to dose and monitor heparin drip for ACS.  CBC obtained last night. Baseline labs ordered. Not taking any anticoagulants PTA.  Today, 05/18/18  Hgb 14.2 on admission  Plt 188 on admission  Troponin: 0.38 --> 6.59 --> 13.53  Patient was on heparin 5000 units subcutaneously q8h for DVT prophylaxis. Last dose 8/19 @ 0624  Goal of Therapy:  Heparin level 0.3-0.7 units/ml Monitor platelets by anticoagulation protocol: Yes   Plan:   Discontinue subcutaneous heparin  Give 3000 units bolus x 1 (slightly reduced bolus as pt had just received 5000 units subcutaneous heparin)  Start heparin infusion at 1300 units/hr  Check anti-Xa level in 6 hours and daily while on heparin  Continue to monitor H&H and platelets  Cindi CarbonMary M Annamary Buschman, PharmD, BCPS Clinical Pharmacist Pager: [phone] 05/18/2018,10:59 AM

## 2018-05-18 NOTE — H&P (View-Only) (Signed)
Cardiology Consultation:   Patient ID: Jason Ewing; [id number]; 11/02/1965   Admit date: 05/17/2018 Date of Consult: 05/18/2018  Primary Care Provider: Patient, No Pcp Per Primary Cardiologist: Brian Crenshaw, MD - NEW  Patient Profile:   Jason Ewing is a 52 y.o. male with a hx of hypertension and asthma who is being seen today for the evaluation of Non-STEMI at the request of Dr. Choi.  History of Present Illness:   Jason Ewing is here from [place] moving his daughter here for college. Yesterday morning he awoke at 6:30 AM with left-sided chest sharp  pain/pressure with associated shortness of breath, but no diaphoresis, lightheadedness or nausea.  He says that this took him to his knees.  He was able to go back to sleep and awoke at about 730 feeling about the same.  He went from his hotel to his daughter's house to help remove furniture and continued to feel discomfort.  His daughter encouraged him to go to the hospital.  Once here his pain was relieved and has not returned.  He admits to having milder similar episodes over the last few weeks intermittently.  He works installing floors and carpeting and says that he has been having some exertional chest pressure and shortness of breath.  Mr. Lobato denies a prior history of diagnosed hypertension, however he has not been to a doctor in many years.  He feels that he has been healthy.  He denies any smoking and he only drinks 1 scotch drink about every few weeks.  He does not know his father's health history.  He thinks his mother may have had a stents in the past but he is not sure.  He was told that he had an enlarged heart in his 20s.  EKG: Sinus rhythm, 69 bpm, Abnormal R-wave progression, early transition, LVH with secondary repolarization abnormality Troponins are elevated 0.38>>6.96>> 13.53 CXR: no acute cardiopulmonary disease D-dimer was elevated at 0.67 CT chest showed no PE, no thoracic aneurysm or dissection.  TSH was normal  at 0.365 A1c 5.6 SCr 1.10  Past Medical History:  Diagnosis Date  . Hypertension     History reviewed. No pertinent surgical history.   Home Medications:  Prior to Admission medications   Medication Sig Start Date End Date Taking? Authorizing Provider  aspirin 325 MG tablet Take 650 mg by mouth every 6 (six) hours as needed for mild pain or moderate pain.   Yes [provider]    Inpatient Medications: Scheduled Meds: . aspirin EC  81 mg Oral Daily  . atorvastatin  80 mg Oral q1800  . heparin  3,000 Units Intravenous Once  . metoprolol tartrate  25 mg Oral BID   Continuous Infusions: . heparin     PRN Meds: acetaminophen, ALPRAZolam, gi cocktail, hydrALAZINE, morphine injection, nitroGLYCERIN, ondansetron (ZOFRAN) IV  Allergies:   No Known Allergies  Social History:   Social History   Socioeconomic History  . Marital status: Single    Spouse name: Not on file  . Number of children: Not on file  . Years of education: Not on file  . Highest education level: Not on file  Occupational History  . Not on file  Social Needs  . Financial resource strain: Not hard at all  . Food insecurity:    Worry: Never true    Inability: Never true  . Transportation needs:    Medical: No    Non-medical: No  Tobacco Use  . Smoking status: Never Smoker  .   Smokeless tobacco: Never Used  Substance and Sexual Activity  . Alcohol use: Not on file  . Drug use: Not on file  . Sexual activity: Not on file  Lifestyle  . Physical activity:    Days per week: Patient refused    Minutes per session: Patient refused  . Stress: Patient refused  Relationships  . Social connections:    Talks on phone: Patient refused    Gets together: Patient refused    Attends religious service: Patient refused    Active member of club or organization: Patient refused    Attends meetings of clubs or organizations: Patient refused    Relationship status: Patient refused  . Intimate partner  violence:    Fear of current or ex partner: No    Emotionally abused: No    Physically abused: No    Forced sexual activity: No  Other Topics Concern  . Not on file  Social History Narrative  . Not on file    Family History:    Family History  Problem Relation Age of Onset  . Hypertension Mother   . CAD Mother        ? had stent placed  . Hypertension Sister      ROS:  Please see the history of present illness.   All other ROS reviewed and negative.     Physical Exam/Data:   Vitals:   05/17/18 1734 05/17/18 2035 05/18/18 0014 05/18/18 0530  BP: (!) 129/91 127/78 121/75 118/86  Pulse: 63 68 64 66  Resp: 18 12 16 12  Temp: 98.4 F (36.9 C) 98.4 F (36.9 C) 98.7 F (37.1 C) 98.2 F (36.8 C)  TempSrc: Oral Oral Oral Oral  SpO2: 99%  95% 100%  Weight: 112.3 kg     Height: 6' 3" (1.905 m)       Intake/Output Summary (Last 24 hours) at 05/18/2018 0820 Last data filed at 05/17/2018 2300 Gross per 24 hour  Intake -  Output 150 ml  Net -150 ml   Filed Weights   05/17/18 1734  Weight: 112.3 kg   Body mass index is 30.94 kg/m.  General:  Well nourished, well developed, in no acute distress HEENT: normal Lymph: no adenopathy Neck: no JVD Endocrine:  No thryomegaly Vascular: No carotid bruits; FA pulses 2+ bilaterally without bruits  Cardiac:  normal S1, S2; RRR; no murmur  Lungs:  clear to auscultation bilaterally, no wheezing, rhonchi or rales  Abd: soft, nontender, no hepatomegaly  Ext: no edema Musculoskeletal:  No deformities, BUE and BLE strength normal and equal Skin: warm and dry  Neuro:  CNs 2-12 intact, no focal abnormalities noted Psych:  Normal affect   EKG:  The EKG was personally reviewed and demonstrates:  Sinus rhythm, 69 bpm, Abnormal R-wave progression, early transition, LVH with secondary repolarization abnormality. This am deeper TWI, diffusely  Telemetry:  Telemetry was personally reviewed and demonstrates:  SR in the 60's-70's with occ  PVCs  Relevant CV Studies:  None  Laboratory Data:  Chemistry Recent Labs  Lab 05/17/18 1157 05/17/18 1855  NA 142  --   K 3.9  --   CL 106  --   CO2 26  --   GLUCOSE 103*  --   BUN 15  --   CREATININE 1.10 1.16  CALCIUM 9.3  --   GFRNONAA >60 >60  GFRAA >60 >60  ANIONGAP 10  --     No results for input(s): PROT, ALBUMIN, AST, ALT, ALKPHOS, BILITOT   in the last 168 hours. Hematology Recent Labs  Lab 05/17/18 1157 05/17/18 1855  WBC 4.2 4.6  RBC 5.40 4.94  HGB 15.5 14.2  HCT 46.1 42.1  MCV 85.4 85.2  MCH 28.7 28.7  MCHC 33.6 33.7  RDW 13.0 13.0  PLT 189 188   Cardiac Enzymes Recent Labs  Lab 05/17/18 1855 05/18/18 0013  TROPONINI 6.59*  6.96* 13.53*    Recent Labs  Lab 05/17/18 1221  TROPIPOC 0.38*    BNPNo results for input(s): BNP, PROBNP in the last 168 hours.  DDimer  Recent Labs  Lab 05/17/18 1327  DDIMER 0.67*    Radiology/Studies:  Dg Chest 2 View  Result Date: 05/17/2018 CLINICAL DATA:  Left-sided chest pain.  Hypertension.  Asthma. EXAM: CHEST - 2 VIEW COMPARISON:  None. FINDINGS: Midline trachea. Normal heart size. Tortuous thoracic aorta. No pleural effusion or pneumothorax. Clear lungs. Numerous leads and wires project over the chest. IMPRESSION: No acute cardiopulmonary disease. Electronically Signed   By: Kyle  Talbot M.D.   On: 05/17/2018 13:08   Ct Angio Chest Pe W And/or Wo Contrast  Result Date: 05/17/2018 CLINICAL DATA:  Chest pain EXAM: CT ANGIOGRAPHY CHEST WITH CONTRAST TECHNIQUE: Multidetector CT imaging of the chest was performed using the standard protocol during bolus administration of intravenous contrast. Multiplanar CT image reconstructions and MIPs were obtained to evaluate the vascular anatomy. CONTRAST:  95 mL ISOVUE-370 IOPAMIDOL (ISOVUE-370) INJECTION 76% COMPARISON:  Chest radiograph May 17, 2018 FINDINGS: Cardiovascular: There is no demonstrable pulmonary embolus. There is no thoracic aortic aneurysm or  dissection. The visualized great vessels appear normal. There is no pericardial effusion or pericardial thickening. The main pulmonary outflow tract measures 3.2 cm, prominent. Mediastinum/Nodes: Visualized thyroid appears unremarkable. There is no appreciable thoracic adenopathy. No esophageal lesions are evident. Lungs/Pleura: The lungs are clear. No pleural effusion or pleural thickening evident. Upper Abdomen: Visualized upper abdominal structures appear unremarkable. Musculoskeletal: There are no blastic or lytic bone lesions. No chest wall lesions are evident. Review of the MIP images confirms the above findings. IMPRESSION: 1. No demonstrable pulmonary embolus. No thoracic aortic aneurysm or dissection. 2. Prominence the main pulmonary outflow tract, a finding felt to be indicative of a degree of pulmonary arterial hypertension. 3.  No edema or consolidation. 4.  No evident thoracic adenopathy. Electronically Signed   By: William  Woodruff III M.D.   On: 05/17/2018 14:42    Assessment and Plan:   Non-STEMI -No prior known cardiac history except for "enlarged heart".  -Typical chest discomfort with left arm numbness and shortness of breath. Currently chest pain free.  -EKG: Sinus rhythm, 69 bpm, Abnormal R-wave progression, early transition, LVH with secondary repolarization abnormality. Mild anterior ST Changes this am.  -Troponins are elevated 0.38>>6.96>> 13.53 -CXR: no acute cardiopulmonary disease -D-dimer was elevated at 0.67. CT chest showed no PE, no thoracic aneurysm or dissection.  -TSH was normal at 0.365 -A1c 5.6 -SCr 1.10 -BB started. Aspirin 325 mg given.  -Will start IV heparin, aspirin 81 mg. -He has NTG paste on, will continue this.  -Plan for cardiac catheterization with possible intervention considering his typical symptoms and elevated troponin.   Hypertension -On no meds at home, no recent medical care -Has been started on metoprolol 25 mg bid -BP initially elevated  at 168/116. Now well controlled.   Hyperlipidemia -LDL 147. Will start high intensity statin with atorvastatin 80 mg daily.   For questions or updates, please contact [facility] Please consult [email or web address] for contact   info under Cardiology/STEMI.   Signed, Janine Hammond, NP  05/18/2018 8:20 AM  As above, patient seen and examined.  Briefly he is a 52-year-old male who typically does not follow with physicians.  He is in town from [place] moving his daughter in for college.  Over the past 2 months he has had pain in his chest described as sharp with associated left arm numbness predominantly with exertion relieved with rest.  He awoke yesterday morning at 5:30 AM with pain that did not resolve.  He fell back asleep and the pain was persistent 2 hours later.  He eventually was convinced to come to the emergency room and his pain resolved last evening.  He is presently pain-free and cardiology asked to evaluate. Troponin is 13.53. ECG shows sinus rhythm with occasional PVC.  Left ventricular hypertrophy.  Anterior T wave inversion concerning for LAD lesion.  Prolonged QT interval. 1 non-ST elevation myocardial infarction-patient presents with symptoms classic for unstable angina/non-ST elevation myocardial infarction.  His troponin is elevated and his electrocardiogram suggest LAD lesion.  He is presently pain-free.  Plan to treat with aspirin, nitrates, heparin, metoprolol and statin.  Proceed with cardiac catheterization.  The risks and benefits including myocardial infarction, CVA and death discussed and he agrees to proceed.  2 probable hypertension-patient does not typically follow with a physician.  He has left ventricular hypertrophy on his ECG.  Continue present medications and advance regimen as needed to control blood pressure.  3 hyperlipidemia-again Lipitor 80 mg daily.  Check lipids and liver in 4 weeks.  Brian Crenshaw, MD  

## 2018-05-19 ENCOUNTER — Telehealth: Payer: Self-pay

## 2018-05-19 ENCOUNTER — Encounter (HOSPITAL_COMMUNITY): Payer: Self-pay | Admitting: Cardiovascular Disease

## 2018-05-19 ENCOUNTER — Inpatient Hospital Stay (HOSPITAL_COMMUNITY): Payer: Self-pay

## 2018-05-19 DIAGNOSIS — I340 Nonrheumatic mitral (valve) insufficiency: Secondary | ICD-10-CM

## 2018-05-19 LAB — TROPONIN I
Troponin I: 10.03 ng/mL (ref <0.03)
Troponin I: 7.22 ng/mL (ref <0.03)

## 2018-05-19 LAB — CBC
HCT: 44.5 % (ref 39.0–52.0)
Hemoglobin: 14.4 g/dL (ref 13.0–17.0)
MCH: 28.1 pg (ref 26.0–34.0)
MCHC: 32.4 g/dL (ref 30.0–36.0)
MCV: 86.9 fL (ref 78.0–100.0)
Platelets: 158 10*3/uL (ref 150–400)
RBC: 5.12 MIL/uL (ref 4.22–5.81)
RDW: 12.7 % (ref 11.5–15.5)
WBC: 5 10*3/uL (ref 4.0–10.5)

## 2018-05-19 LAB — ECHOCARDIOGRAM COMPLETE
HEIGHTINCHES: 75 in
Weight: 3985.92 oz

## 2018-05-19 LAB — BASIC METABOLIC PANEL
Anion gap: 10 (ref 5–15)
BUN: 11 mg/dL (ref 6–20)
CO2: 22 mmol/L (ref 22–32)
Calcium: 8.6 mg/dL — ABNORMAL LOW (ref 8.9–10.3)
Chloride: 107 mmol/L (ref 98–111)
Creatinine, Ser: 1.18 mg/dL (ref 0.61–1.24)
GFR calc Af Amer: >60 mL/min (ref >60)
GFR calc non Af Amer: >60 mL/min (ref >60)
Glucose, Bld: 113 mg/dL — ABNORMAL HIGH (ref 70–99)
POTASSIUM: 3.3 mmol/L — AB (ref 3.5–5.1)
SODIUM: 139 mmol/L (ref 135–145)

## 2018-05-19 LAB — MAGNESIUM: MAGNESIUM: 1.9 mg/dL (ref 1.7–2.4)

## 2018-05-19 MED ORDER — SPIRONOLACTONE 12.5 MG HALF TABLET
12.5000 mg | ORAL_TABLET | Freq: Every day | ORAL | Status: DC
  Administered 2018-05-19: 10:00:00 12.5 mg via ORAL
  Filled 2018-05-19: qty 1

## 2018-05-19 MED ORDER — ATORVASTATIN CALCIUM 80 MG PO TABS: 80.0000 mg | ORAL_TABLET | Freq: Every day | ORAL | 5 refills | Status: AC

## 2018-05-19 MED ORDER — METOPROLOL SUCCINATE ER 50 MG PO TB24
50.0000 mg | ORAL_TABLET | Freq: Every day | ORAL | Status: DC
  Administered 2018-05-19: 10:00:00 50 mg via ORAL
  Filled 2018-05-19: qty 1

## 2018-05-19 MED ORDER — SPIRONOLACTONE 25 MG PO TABS: 12.5000 mg | ORAL_TABLET | Freq: Every day | ORAL | 5 refills | Status: AC

## 2018-05-19 MED ORDER — ASPIRIN 81 MG PO CHEW: 81.0000 mg | CHEWABLE_TABLET | Freq: Every day | ORAL | Status: AC

## 2018-05-19 MED ORDER — METOPROLOL SUCCINATE ER 50 MG PO TB24: 50.0000 mg | ORAL_TABLET | Freq: Every day | ORAL | 5 refills | Status: AC

## 2018-05-19 MED ORDER — TICAGRELOR 90 MG PO TABS: 90.0000 mg | ORAL_TABLET | Freq: Two times a day (BID) | ORAL | 0 refills | Status: AC

## 2018-05-19 MED ORDER — LOSARTAN POTASSIUM 50 MG PO TABS: 50.0000 mg | ORAL_TABLET | Freq: Every day | ORAL | 5 refills | Status: AC

## 2018-05-19 MED ORDER — TICAGRELOR 90 MG PO TABS: 90.0000 mg | ORAL_TABLET | Freq: Two times a day (BID) | ORAL | 3 refills | Status: DC

## 2018-05-19 MED ORDER — POTASSIUM CHLORIDE CRYS ER 20 MEQ PO TBCR
40.0000 meq | EXTENDED_RELEASE_TABLET | Freq: Once | ORAL | Status: AC
  Administered 2018-05-19: 10:00:00 40 meq via ORAL
  Filled 2018-05-19: qty 2

## 2018-05-19 MED ORDER — LOSARTAN POTASSIUM 50 MG PO TABS
50.0000 mg | ORAL_TABLET | Freq: Every day | ORAL | Status: DC
  Administered 2018-05-19: 10:00:00 50 mg via ORAL
  Filled 2018-05-19: qty 1

## 2018-05-19 MED ORDER — NITROGLYCERIN 0.4 MG SL SUBL: 0.4000 mg | SUBLINGUAL_TABLET | SUBLINGUAL | 2 refills | Status: AC | PRN

## 2018-05-19 NOTE — Telephone Encounter (Signed)
[facility] to [facility].

## 2018-05-19 NOTE — Discharge Summary (Signed)
Discharge Summary    Patient ID: Jason Ewing,  MRN: [id number], DOB/AGE: 52/11/1965 52 y.o.  Admit date: 05/17/2018 Discharge date: 05/19/2018  Primary Care Provider: Patient, No Pcp Per Primary Cardiologist: Olga MillersBrian Crenshaw, MD  Discharge Diagnoses    Principal Problem:   NSTEMI (non-ST elevated myocardial infarction) [facility](HCC) Active Problems:   Chest pain   HTN (hypertension)   HLD (hyperlipidemia)   Allergies No Known Allergies  Diagnostic Studies/Procedures     LHC 05/18/18 Procedures   CORONARY STENT INTERVENTION  LEFT HEART CATH AND CORONARY ANGIOGRAPHY  Conclusion     Prox LAD lesion is 100% stenosed.  Ost RCA to Prox RCA lesion is 60% stenosed.  Mid RCA lesion is 60% stenosed.  A drug-eluting stent was successfully placed.  Post intervention, there is a 0% residual stenosis.  There is mild to moderate left ventricular systolic dysfunction.  LV end diastolic pressure is mildly elevated.  The left ventricular ejection fraction is 45-50% by visual estimate.   Coronary Diagrams   Diagnostic Diagram       Post-Intervention Diagram          2D Echo 05/19/18 Study Conclusions  - Technical notes: Septal apical and inferior basal hypokinesis - Left ventricle: The cavity size was mildly dilated. Systolic   function was mildly reduced. The estimated ejection fraction was   in the range of 45% to 50%. Left ventricular diastolic function   parameters were normal. - Mitral valve: There was mild regurgitation. - Atrial septum: No defect or patent foramen ovale was identified.   History of Present Illness     Jason Ewing is a 52 y.o. male with a hx of hypertension and asthma who [facility]18 with complaint of chest pain.   [place] E[place] is [place] from [place] moving his daughter here for college. The morning of 05/16/18, he awoke at 6:30 AM with left-sided chest sharp  pain/pressure with associated shortness of breath, but  no diaphoresis, lightheadedness or nausea. He says that this took him to his knees.  He was able to go back to sleep and awoke at about 730 feeling about the same.  He went from his hotel to his daughter's house to help remove furniture and continued to feel discomfort.  His daughter encouraged him to go to the hospital.  Once here his pain was relieved and has not returned.  He admits to having milder similar episodes over the last few weeks intermittently.  He works Dietitianinstalling floors and carpeting and says that he has been having some exertional chest pressure and shortness of breath.  Jason Ewing denies a prior history of diagnosed hypertension, however he has not been to a doctor in many years.  He feels that he has been healthy.  He denies any smoking and he only drinks 1 scotch drink about every few weeks.  He does not know his father's health history.  He thinks his mother may have had a stents in the past but he is not sure.  He was told that he had an enlarged heart in his 1420s.  In the ED, EKG showed Sinus rhythm, 69 bpm, Abnormal R-wave progression, early transition, LVH with secondary repolarization abnormality CXR: no acute cardiopulmonary disease. D-dimer was elevated at 0.67, however CT chest showed no PE, no thoracic aneurysm or dissection. Troponins were elevated at, 0.38>>6.96>> 13.53, ruling in NSTEMI.   He was started on IV heparin and admitted for management of NSTEMI.  Hospital Course     Pt underwent cardiac cath on 05/18/18 which showed 100% proximal LAD stenosis. The mid LAD was being filled by collaterals from the distal RCA. The proximal LAD was successfully treated with PCI utilizing a DES. There was also a 60% ostial to proximal RCA lesion, to be treated medically. LV gram showed mild systolic impairment, w/ EF estimated at 45-50%. He tolerated the procedure well and left the cath lab in stable condition. He was placed on DAPT w/ ASA and Brilinta, along with high intensity  statin therapy with Lipitor, BB and ARB therapy, along with spironolactone. Lipid panel showed elevated LDL at 147 mg/dL (goal LDL < 70 mg/dL). Hgb A1c was 5.6. 2D echo was also performed which confirmed mildly reduced LVEF at 45-50% with septal apical and inferior basal hypokinesis. He had no recurrent CP post PCI and no post cath complications. He ambulated with cardiac rehab w/o exertional symptoms. Vital signs remained stable as well as renal function and cath access site. He was last seen and examined by Dr. Jens Somrenshaw, who determined he was stable for discharge home. He will f/u in our office with a TOC appt in 1 week with f/u BMP. He will need to establish care with a primary cardiologist when he returns home to [place].   Consultants: none   Discharge Vitals Blood pressure (!) 132/93, pulse 74, temperature 98.6 F (37 C), temperature source Oral, resp. rate (!) 24, height 6\' 3"  (1.905 m), weight 113 kg, SpO2 100 %.  Filed Weights   05/17/18 1734 05/18/18 0851 05/19/18 0624  Weight: 112.3 kg 112.3 kg 113 kg    Labs & Radiologic Studies    CBC Recent Labs    05/17/18 1855 05/19/18 0440  WBC 4.6 5.0  HGB 14.2 14.4  HCT 42.1 44.5  MCV 85.2 86.9  PLT 188 158   Basic Metabolic Panel Recent Labs    16/07/9607/18/19 1157 05/17/18 1855 05/19/18 0440  NA 142  --  139  K 3.9  --  3.3*  CL 106  --  107  CO2 26  --  22  GLUCOSE 103*  --  113*  BUN 15  --  11  CREATININE 1.10 1.16 1.18  CALCIUM 9.3  --  8.6*  MG  --  2.3 1.9  PHOS  --  3.2  --    Liver Function Tests No results for input(s): AST, ALT, ALKPHOS, BILITOT, PROT, ALBUMIN in the last 72 hours. No results for input(s): LIPASE, AMYLASE in the last 72 hours. Cardiac Enzymes Recent Labs    05/18/18 1737 05/18/18 2302 05/19/18 0440  TROPONINI 8.22* 10.03* 7.22*   BNP Invalid input(s): POCBNP D-Dimer Recent Labs    05/17/18 1327  DDIMER 0.67*   Hemoglobin A1C Recent Labs    05/18/18 0013  HGBA1C 5.6    Fasting Lipid Panel Recent Labs    05/18/18 0013  CHOL 214*  HDL 44  LDLCALC 147*  TRIG 116  CHOLHDL 4.9   Thyroid Function Tests Recent Labs    05/17/18 1855  TSH 0.365   _____________  Dg Chest 2 View  Result Date: 05/17/2018 CLINICAL DATA:  Left-sided chest pain.  Hypertension.  Asthma. EXAM: CHEST - 2 VIEW COMPARISON:  None. FINDINGS: Midline trachea. Normal heart size. Tortuous thoracic aorta. No pleural effusion or pneumothorax. Clear lungs. Numerous leads and wires project over the chest. IMPRESSION: No acute cardiopulmonary disease. Electronically Signed   By: Jeronimo GreavesKyle  Talbot M.D.   On: 05/17/2018  13:08   Ct Angio Chest Pe W And/or Wo Contrast  Result Date: 05/17/2018 CLINICAL DATA:  Chest pain EXAM: CT ANGIOGRAPHY CHEST WITH CONTRAST TECHNIQUE: Multidetector CT imaging of the chest was performed using the standard protocol during bolus administration of intravenous contrast. Multiplanar CT image reconstructions and MIPs were obtained to evaluate the vascular anatomy. CONTRAST:  95 mL ISOVUE-370 IOPAMIDOL (ISOVUE-370) INJECTION 76% COMPARISON:  Chest radiograph May 17, 2018 FINDINGS: Cardiovascular: There is no demonstrable pulmonary embolus. There is no thoracic aortic aneurysm or dissection. The visualized great vessels appear normal. There is no pericardial effusion or pericardial thickening. The main pulmonary outflow tract measures 3.2 cm, prominent. Mediastinum/Nodes: Visualized thyroid appears unremarkable. There is no appreciable thoracic adenopathy. No esophageal lesions are evident. Lungs/Pleura: The lungs are clear. No pleural effusion or pleural thickening evident. Upper Abdomen: Visualized upper abdominal structures appear unremarkable. Musculoskeletal: There are no blastic or lytic bone lesions. No chest wall lesions are evident. Review of the MIP images confirms the above findings. IMPRESSION: 1. No demonstrable pulmonary embolus. No thoracic aortic aneurysm or  dissection. 2. Prominence the main pulmonary outflow tract, a finding felt to be indicative of a degree of pulmonary arterial hypertension. 3.  No edema or consolidation. 4.  No evident thoracic adenopathy. Electronically Signed   By: Bretta Bang III M.D.   On: 05/17/2018 14:42   Disposition   Pt is being discharged home today in good condition.  Follow-up Plans & Appointments    Follow-up Information    [facility] Follow up.   Specialty:  Internal Medicine Why:  appointmenmt on 05/27/2018 at 9:20 am, please call to reschedule if you are unable to make appointment Contact information: [address] [place] [address] [phone]       Lewayne Bunting, MD Follow up.   Specialty:  Cardiology Why:  our office will call to arrange hospital follow-up in our clinic in 1 week  Contact information: [address] [place] [address] [phone]          Discharge Instructions    AMB Referral to Cardiac Rehabilitation - Phase II   Complete by:  As directed    Diagnosis:  NSTEMI   Amb Referral to Cardiac Rehabilitation   Complete by:  As directed    Will send referral for Cardiac Rehab Phase 2 to [place]   Diagnosis:   Coronary Stents NSTEMI     Diet - low sodium heart healthy   Complete by:  As directed    Increase activity slowly   Complete by:  As directed       Discharge Medications   Allergies as of 05/19/2018   No Known Allergies     Medication List    STOP taking these medications   aspirin 325 MG tablet Replaced by:  aspirin 81 MG chewable tablet     TAKE these medications   aspirin 81 MG chewable tablet Chew 1 tablet (81 mg total) by mouth daily. Start taking on:  05/20/2018 Replaces:  aspirin 325 MG tablet   atorvastatin 80 MG tablet Commonly known as:  LIPITOR Take 1 tablet (80 mg total) by mouth daily at 6 PM.   losartan 50 MG tablet Commonly known as:  COZAAR Take 1 tablet (50  mg total) by mouth daily. Start taking on:  05/20/2018   metoprolol succinate 50 MG 24 hr tablet Commonly known as:  TOPROL-XL Take 1 tablet (50 mg total) by  mouth daily. Take with or immediately following a meal. Start taking on:  05/20/2018   nitroGLYCERIN 0.4 MG SL tablet Commonly known as:  NITROSTAT Place 1 tablet (0.4 mg total) under the tongue every 5 (five) minutes as needed for chest pain.   spironolactone 25 MG tablet Commonly known as:  ALDACTONE Take 0.5 tablets (12.5 mg total) by mouth daily. Start taking on:  05/20/2018   ticagrelor 90 MG Tabs tablet Commonly known as:  BRILINTA Take 1 tablet (90 mg total) by mouth 2 (two) times daily.   ticagrelor 90 MG Tabs tablet Commonly known as:  BRILINTA Take 1 tablet (90 mg total) by mouth 2 (two) times daily.        Acute coronary syndrome (MI, NSTEMI, STEMI, etc) this admission?: Yes.     AHA/ACC Clinical Performance & Quality Measures: 1. Aspirin prescribed? - Yes 2. ADP Receptor Inhibitor (Plavix/Clopidogrel, Brilinta/Ticagrelor or Effient/Prasugrel) prescribed (includes medically managed patients)? - Yes 3. Beta Blocker prescribed? - Yes 4. High Intensity Statin (Lipitor 40-80mg  or Crestor 20-40mg ) prescribed? - Yes 5. EF assessed during THIS hospitalization? - Yes 6. For EF <40%, was ACEI/ARB prescribed? - Yes 7. For EF <40%, Aldosterone Antagonist (Spironolactone or Eplerenone) prescribed? - Yes 8. Cardiac Rehab Phase II ordered (Included Medically managed Patients)? - Yes     Outstanding Labs/Studies   F/u BMP in 7 days. FLP and HFTs in 6-8 weeks.   Duration of Discharge Encounter   Greater than 30 minutes including physician time.  Signed, Robbie Lis, PA-C 05/19/2018, 12:58 PM

## 2018-05-19 NOTE — Care Management Note (Signed)
Case Management Note  Patient Details  Name: Jason Ewing MRN: [id number] Date of Birth: 04/23/1966  Subjective/Objective:   NSTEMI                 Action/Plan:  NCM spoke to pt and sister at bedside. Pt lives in [place] DecFriend, [place]ashawn states pt can apply for assistance with [facility] were he can apply for a discount card to see providers in their system. He will continue to follow up with providers in [place] until [facility]. Provided pt with a Brilinta 30 day free trial card and completed patient assistance application with [employer]. Application faxed to [employer]. Pt provided application to follow up this week on approval. Provided MATCH letter with $3 copay for once per year use. [facility] on 920 am to follow up with PCP.    Expected Discharge Date:  05/19/18               Expected Discharge Plan:  Home/Self Care  In-House Referral:  NA  Discharge planning Services  CM Consult, Follow-up appt scheduled, Medication Assistance, MATCH Program  Post Acute Care Choice:  NA Choice offered to:  NA  DME Arranged:  N/A DME Agency:  NA  HH Arranged:  NA HH Agency:  NA  Status of Service:  Completed, signed off  If discussed at Long Length of Stay Meetings, dates discussed:    Additional Comments:  Jason Ewing, Jason [place] [name], RN 05/19/2018, 3:24 PM

## 2018-05-19 NOTE — Telephone Encounter (Signed)
-----   Message from Allayne ButcherBrittainy M Simmons, [place] sent at 05/19/2018 12:31 PM EDT ----- Regarding: TOC post hospital  This pt will need TOC post hospital f/u in 1 week, s/p NSTEMI and stent placement. He lives in [place] but will be here for f/u in 1 week and [facility]. Due to his situation, please try to fit him in in 1 week. You may have to use a reserved 72 hr slot if need be. Thanks.

## 2018-05-19 NOTE — Progress Notes (Signed)
  Echocardiogram 2D Echocardiogram has been performed.  Jason Ewing G Kierstan Auer 05/19/2018, 10:15 AM

## 2018-05-19 NOTE — Progress Notes (Signed)
CARDIAC REHAB PHASE I   PRE:  Rate/Rhythm: 88 SR  BP:  Sitting: 117/76      SaO2: 99 RA  MODE:  Ambulation: 1300 ft  101 peak HR  POST:  Rate/Rhythm: 95 SR  BP:  Sitting: 143/107    SaO2: 98 RA   Pt ambulated 138300ft in hallway independently with steady gait. Pt and daughter educated on importance of Brilinta, ASA, statin, and NTG. Stent card and MI book at bedside. Pt given heart healthy diet. Reviewed restrictions and exercise guidelines. Will send CRP II referral to [facility] in [place]   [phone] Jason Ewing  Jade Burright, RN BSN 05/19/2018 8:48 AM

## 2018-05-19 NOTE — Progress Notes (Signed)
Progress Note  Patient Name: Jason MaduroRobert Ewing Date of Encounter: 05/19/2018  Primary Cardiologist: Olga MillersBrian Crenshaw, MD   Subjective   No chest pain or dyspnea  Inpatient Medications    Scheduled Meds: . aspirin  81 mg Oral Daily  . atorvastatin  80 mg Oral q1800  . metoprolol tartrate  25 mg Oral BID  . nitroGLYCERIN  1 inch Topical Q6H  . sodium chloride flush  3 mL Intravenous Q12H  . ticagrelor  90 mg Oral BID   Continuous Infusions: . sodium chloride     PRN Meds: sodium chloride, acetaminophen, ALPRAZolam, gi cocktail, hydrALAZINE, morphine injection, nitroGLYCERIN, ondansetron (ZOFRAN) IV, sodium chloride flush, traMADol   Vital Signs    Vitals:   05/18/18 1800 05/18/18 2114 05/19/18 0624 05/19/18 0721  BP: (!) 165/106 (!) 155/95 136/88 (!) 132/93  Pulse:  87 76 74  Resp: 18 19 16  (!) 24  Temp:  98.9 F (37.2 C) 98.7 F (37.1 C) 98.6 F (37 C)  TempSrc:  Oral Oral   SpO2: 99% 99% 98% 100%  Weight:   113 kg   Height:        Intake/Output Summary (Last 24 hours) at 05/19/2018 0838 Last data filed at 05/19/2018 [id number] Gross per 24 hour  Intake 1641.7 ml  Output 1200 ml  Net 441.7 ml   Filed Weights   05/17/18 1734 05/18/18 0851 05/19/18 0624  Weight: 112.3 kg 112.3 kg 113 kg    Telemetry    Sinus with PVCs - Personally Reviewed   Physical Exam   GEN: No acute distress.   Neck: No JVD Cardiac: RRR, no murmurs, rubs, or gallops.  Respiratory: Clear to auscultation bilaterally. GI: Soft, nontender, non-distended  MS: No edema; cath site with no hematoma Neuro:  Nonfocal  Psych: Normal affect   Labs    Chemistry Recent Labs  Lab 05/17/18 1157 05/17/18 1855 05/19/18 0440  NA 142  --  139  K 3.9  --  3.3*  CL 106  --  107  CO2 26  --  22  GLUCOSE 103*  --  113*  BUN 15  --  11  CREATININE 1.10 1.16 1.18  CALCIUM 9.3  --  8.6*  GFRNONAA >60 >60 >60  GFRAA >60 >60 >60  ANIONGAP 10  --  10     Hematology Recent Labs  Lab  05/17/18 1157 05/17/18 1855 05/19/18 0440  WBC 4.2 4.6 5.0  RBC 5.40 4.94 5.12  HGB 15.5 14.2 14.4  HCT 46.1 42.1 44.5  MCV 85.4 85.2 86.9  MCH 28.7 28.7 28.1  MCHC 33.6 33.7 32.4  RDW 13.0 13.0 12.7  PLT 189 188 158    Cardiac Enzymes Recent Labs  Lab 05/18/18 0013 05/18/18 1737 05/18/18 2302 05/19/18 0440  TROPONINI 13.53* 8.22* 10.03* 7.22*    Recent Labs  Lab 05/17/18 1221  TROPIPOC 0.38*     DDimer  Recent Labs  Lab 05/17/18 1327  DDIMER 0.67*     Radiology    Dg Chest 2 View  Result Date: 05/17/2018 CLINICAL DATA:  Left-sided chest pain.  Hypertension.  Asthma. EXAM: CHEST - 2 VIEW COMPARISON:  None. FINDINGS: Midline trachea. Normal heart size. Tortuous thoracic aorta. No pleural effusion or pneumothorax. Clear lungs. Numerous leads and wires project over the chest. IMPRESSION: No acute cardiopulmonary disease. Electronically Signed   By: Jeronimo GreavesKyle  Talbot M.D.   On: 05/17/2018 13:08   Ct Angio Chest Pe W And/or Wo Contrast  Result Date: 05/17/2018  CLINICAL DATA:  Chest pain EXAM: CT ANGIOGRAPHY CHEST WITH CONTRAST TECHNIQUE: Multidetector CT imaging of the chest was performed using the standard protocol during bolus administration of intravenous contrast. Multiplanar CT image reconstructions and MIPs were obtained to evaluate the vascular anatomy. CONTRAST:  95 mL ISOVUE-370 IOPAMIDOL (ISOVUE-370) INJECTION 76% COMPARISON:  Chest radiograph May 17, 2018 FINDINGS: Cardiovascular: There is no demonstrable pulmonary embolus. There is no thoracic aortic aneurysm or dissection. The visualized great vessels appear normal. There is no pericardial effusion or pericardial thickening. The main pulmonary outflow tract measures 3.2 cm, prominent. Mediastinum/Nodes: Visualized thyroid appears unremarkable. There is no appreciable thoracic adenopathy. No esophageal lesions are evident. Lungs/Pleura: The lungs are clear. No pleural effusion or pleural thickening evident. Upper  Abdomen: Visualized upper abdominal structures appear unremarkable. Musculoskeletal: There are no blastic or lytic bone lesions. No chest wall lesions are evident. Review of the MIP images confirms the above findings. IMPRESSION: 1. No demonstrable pulmonary embolus. No thoracic aortic aneurysm or dissection. 2. Prominence the main pulmonary outflow tract, a finding felt to be indicative of a degree of pulmonary arterial hypertension. 3.  No edema or consolidation. 4.  No evident thoracic adenopathy. Electronically Signed   By: Bretta BangWilliam  Woodruff III M.D.   On: 05/17/2018 14:42    Patient Profile     52 y.o. male admitted with NSTEMI.  Assessment & Plan    1 non-ST elevation myocardial infarction-s/p PCI of LAD with DES. Continue asa, brilinta, lipitor; DC NT paste; change lopressor to toprol 50 mg daily; add cozaar 50 mg daily and spironolactone 12.5 mg daily. Echo to assess LV function.   2 hypertension-BP mildly elevated; add cozaar given anterior MI and spironolactone; follow BP and adjust meds as needed.  3 hyperlipidemia-continue Lipitor 80 mg daily.  Check lipids and liver in 4 weeks.  4 hypokalemia-supplement  Ambulate today; DC this PM if stable; TOC appt one week with APP and BMET at that time. Pt will then find cardiologist in [place] and FU 3 months later.  >30 min PA and physician time D2  For questions or updates, please contact [facility] Please consult [email or web address] for contact info under Cardiology/STEMI.      Signed, Olga MillersBrian Crenshaw, MD  05/19/2018, 8:38 AM

## 2018-05-19 NOTE — Progress Notes (Signed)
Discharge instructions reviewed with pt and family by RN Silva BandyKristi, SWOT RN.  Copy of instructions and scripts given to pt, as well as scripts sent to pt pharmacy per PA. CM gave information and forms for Brilinta to pt.  Pt d/c'd with belongings, with family, pt declined to go out in wheelchair, pt with steady gait, no c/o pain, no SOB.

## 2018-05-19 NOTE — Progress Notes (Addendum)
TR BAND REMOVAL  LOCATION:    right radial  DEFLATED PER PROTOCOL:    Yes.    TIME BAND OFF / DRESSING APPLIED:    2231  SITE UPON ARRIVAL:    Level 0  SITE AFTER BAND REMOVAL:    Level 0  CIRCULATION SENSATION AND MOVEMENT:    Within Normal Limits   Yes.    COMMENTS:   Post TRband instructions given, good cap refill

## 2018-05-19 NOTE — Progress Notes (Signed)
Discussed with Cardiology, Dr. Jens Somrenshaw had graciously accepted patient on to his service as of this morning.  Hospitalist service will sign off.  Please call if we can be of further assistance.

## 2018-05-21 NOTE — Progress Notes (Signed)
HPI: Follow-up coronary artery disease.  Patient had a left heart catheterization August 2019 after ruling in for non-ST elevation myocardial infarction.  The LAD was occluded.  There is a 60% proximal and 60% mid RCA.  Ejection fraction 45 to 50%.  Patient had PCI of LAD.  Echocardiogram from August 2019 showed ejection fraction 45 to 50% and mild mitral regurgitation.  Since discharge there is no dyspnea, chest pain, palpitations or syncope.  Current Outpatient Medications  Medication Sig Dispense Refill  . aspirin 81 MG chewable tablet Chew 1 tablet (81 mg total) by mouth daily.    Marland Kitchen. atorvastatin (LIPITOR) 80 MG tablet Take 1 tablet (80 mg total) by mouth daily at 6 PM. 30 tablet 5  . losartan (COZAAR) 50 MG tablet Take 1 tablet (50 mg total) by mouth daily. 30 tablet 5  . metoprolol succinate (TOPROL-XL) 50 MG 24 hr tablet Take 1 tablet (50 mg total) by mouth daily. Take with or immediately following a meal. 30 tablet 5  . nitroGLYCERIN (NITROSTAT) 0.4 MG SL tablet Place 1 tablet (0.4 mg total) under the tongue every 5 (five) minutes as needed for chest pain. 25 tablet 2  . spironolactone (ALDACTONE) 25 MG tablet Take 0.5 tablets (12.5 mg total) by mouth daily. 30 tablet 5  . ticagrelor (BRILINTA) 90 MG TABS tablet Take 1 tablet (90 mg total) by mouth 2 (two) times daily. 180 tablet 3  . ticagrelor (BRILINTA) 90 MG TABS tablet Take 1 tablet (90 mg total) by mouth 2 (two) times daily. 60 tablet 0   No current facility-administered medications for this visit.      Past Medical History:  Diagnosis Date  . Asthma   . Coronary artery disease   . Hypertension     Past Surgical History:  Procedure Laterality Date  . CORONARY STENT INTERVENTION  05/18/2018   drug-eluting stent  . CORONARY STENT INTERVENTION N/A 05/18/2018   Procedure: CORONARY STENT INTERVENTION;  Surgeon: Runell GessBerry, Jonathan J, MD;  Location: [facility];  Service: Cardiovascular;  Laterality: N/A;  . LEFT  HEART CATH AND CORONARY ANGIOGRAPHY N/A 05/18/2018   Procedure: LEFT HEART CATH AND CORONARY ANGIOGRAPHY;  Surgeon: Runell GessBerry, Jonathan J, MD;  Location: [facility];  Service: Cardiovascular;  Laterality: N/A;    Social History   Socioeconomic History  . Marital status: Single    Spouse name: Not on file  . Number of children: Not on file  . Years of education: Not on file  . Highest education level: Not on file  Occupational History  . Not on file  Social Needs  . Financial resource strain: Not hard at all  . Food insecurity:    Worry: Never true    Inability: Never true  . Transportation needs:    Medical: No    Non-medical: No  Tobacco Use  . Smoking status: Never Smoker  . Smokeless tobacco: Never Used  Substance and Sexual Activity  . Alcohol use: Yes    Comment: OCCASIONAL  . Drug use: Never  . Sexual activity: Not on file  Lifestyle  . Physical activity:    Days per week: Patient refused    Minutes per session: Patient refused  . Stress: Patient refused  Relationships  . Social connections:    Talks on phone: Patient refused    Gets together: Patient refused    Attends religious service: Patient refused    Active member of club or organization: Patient refused  Attends meetings of clubs or organizations: Patient refused    Relationship status: Patient refused  . Intimate partner violence:    Fear of current or ex partner: No    Emotionally abused: No    Physically abused: No    Forced sexual activity: No  Other Topics Concern  . Not on file  Social History Narrative  . Not on file    Family History  Problem Relation Age of Onset  . Hypertension Mother   . CAD Mother        ? had stent placed  . Hypertension Sister     ROS: no fevers or chills, productive cough, hemoptysis, dysphasia, odynophagia, melena, hematochezia, dysuria, hematuria, rash, seizure activity, orthopnea, PND, pedal edema, claudication. Remaining systems are  negative.  Physical Exam: Well-developed well-nourished in no acute distress.  Skin is warm and dry.  HEENT is normal.  Neck is supple.  Chest is clear to auscultation with normal expansion.  Cardiovascular exam is regular rate and rhythm.  Abdominal exam nontender or distended. No masses palpated. Extremities show no edema. neuro grossly intact  ECG-normal sinus rhythm at a rate of 70.  Anterior T wave inversion.  Personally reviewed  A/P  1 coronary artery disease status post non-ST elevation myocardial infarction and PCI of LAD-plan to continue aspirin, Brilinta and statin.  2 ischemic cardiomyopathy-continue ARB and beta-blocker.  3 hypertension-blood pressure is controlled.  Continue present medications.  4 hyperlipidemia-continue statin.  We will check lipids and liver in 4 weeks.  5 recent hypokalemia-recheck potassium.  6 erectile dysfunction-patient asked about Cialis today.  I explained that he should not take nitroglycerin within 24 hours of taking 1 of these medications.  Olga Millers, MD

## 2018-05-26 ENCOUNTER — Encounter: Payer: Self-pay | Admitting: Cardiology

## 2018-05-27 ENCOUNTER — Other Ambulatory Visit: Payer: Self-pay | Admitting: Family Medicine

## 2018-05-27 ENCOUNTER — Encounter: Payer: Self-pay | Admitting: Cardiology

## 2018-05-27 ENCOUNTER — Ambulatory Visit (INDEPENDENT_AMBULATORY_CARE_PROVIDER_SITE_OTHER): Payer: Self-pay | Admitting: Cardiology

## 2018-05-27 ENCOUNTER — Ambulatory Visit (INDEPENDENT_AMBULATORY_CARE_PROVIDER_SITE_OTHER): Payer: Self-pay | Admitting: Family Medicine

## 2018-05-27 ENCOUNTER — Encounter: Payer: Self-pay | Admitting: Family Medicine

## 2018-05-27 VITALS — BP 140/90 | HR 70 | Temp 98.5°F | Ht 75.0 in | Wt 248.0 lb

## 2018-05-27 VITALS — BP 130/80 | HR 70 | Ht 75.0 in | Wt 249.8 lb

## 2018-05-27 DIAGNOSIS — E785 Hyperlipidemia, unspecified: Secondary | ICD-10-CM

## 2018-05-27 DIAGNOSIS — Z09 Encounter for follow-up examination after completed treatment for conditions other than malignant neoplasm: Secondary | ICD-10-CM

## 2018-05-27 DIAGNOSIS — I214 Non-ST elevation (NSTEMI) myocardial infarction: Secondary | ICD-10-CM

## 2018-05-27 DIAGNOSIS — R072 Precordial pain: Secondary | ICD-10-CM

## 2018-05-27 DIAGNOSIS — Z7689 Persons encountering health services in other specified circumstances: Secondary | ICD-10-CM

## 2018-05-27 DIAGNOSIS — E7800 Pure hypercholesterolemia, unspecified: Secondary | ICD-10-CM

## 2018-05-27 DIAGNOSIS — I10 Essential (primary) hypertension: Secondary | ICD-10-CM

## 2018-05-27 DIAGNOSIS — R8290 Unspecified abnormal findings in urine: Secondary | ICD-10-CM

## 2018-05-27 DIAGNOSIS — J3089 Other allergic rhinitis: Secondary | ICD-10-CM

## 2018-05-27 LAB — POCT URINALYSIS DIP (MANUAL ENTRY)
Bilirubin, UA: NEGATIVE
Glucose, UA: NEGATIVE mg/dL
Ketones, POC UA: NEGATIVE mg/dL
Leukocytes, UA: NEGATIVE
Nitrite, UA: NEGATIVE
Protein Ur, POC: NEGATIVE mg/dL
Spec Grav, UA: 1.015 (ref 1.010–1.025)
Urobilinogen, UA: 0.2 E.U./dL
pH, UA: 6 (ref 5.0–8.0)

## 2018-05-27 MED ORDER — FEXOFENADINE-PSEUDOEPHED ER 60-120 MG PO TB12: 1.0000 | ORAL_TABLET | Freq: Two times a day (BID) | ORAL | 3 refills | Status: AC

## 2018-05-27 NOTE — Patient Instructions (Signed)
Fexofenadine; Pseudoephedrine tablets What is this medicine? FEXOFENADINE; PSEUDOEPHEDRINE (fex oh FEN a deen; soo doe e FED rin) is an antihistamine and a decongestant. This medicine is used to treat or prevent symptoms of allergies. This medicine may be used for other purposes; ask your health care provider or pharmacist if you have questions. COMMON BRAND NAME(S): Allegra-D, Allegra-D 12 Hour Allergy & Congestion, Allegra-D 24 Hour Allergy & Congestion What should I tell my health care provider before I take this medicine? They need to know if you have any of these conditions: -diabetes -difficulty urinating -disease of the blood vessels or heart -glaucoma -high blood pressure -kidney disease -prostate trouble -untreated thyroid problems -an unusual or allergic reaction to fexofenadine, pseudoephedrine, other medicines, foods, dyes, or preservatives -pregnant or trying to get pregnant -breast-feeding How should I use this medicine? Swallow this medicine with a glass of water. Do not break, crush, or chew. Follow the directions on the prescription label. Take the tablet on an empty stomach, one hour before or two hours after a meal. Take your doses at regular intervals. Do not take your medicine more often than directed. Talk to your pediatrician regarding the use of this medicine in children. Special care may be needed. Patients over 52 years old may have a stronger reaction and need a smaller dose. Overdosage: If you think you have taken too much of this medicine contact a poison control center or emergency room at once. NOTE: This medicine is only for you. Do not share this medicine with others. What if I miss a dose? If you miss a dose, take it as soon as you can. If it is almost time for your next dose, take only that dose. Do not take double or extra doses. What may interact with this medicine? Do not take this medicine with any of the following medications: -bromocriptine -ergot  alkaloids like dihydroergotamine, ergonovine, ergotamine, methylergonovine -MAOIs like Carbex, Eldepryl, Marplan, Nardil, and Parnate -procarbazine -stimulant medicines for attention disorders, weight loss, or to stay awake This medicine may also interact with the following medications: -antacids -erythromycin -grapefruit, apple, or orange juice -ketoconazole -linezolid -magnesium products -medicines for blood pressure -medicines for heart problems -methyldopa -some medicines for colds, allergies and breathing difficulties This list may not describe all possible interactions. Give your health care provider a list of all the medicines, herbs, non-prescription drugs, or dietary supplements you use. Also tell them if you smoke, drink alcohol, or use illegal drugs. Some items may interact with your medicine. What should I watch for while using this medicine? Visit your doctor or health care professional for regular check ups. Tell your doctor or health care professional if your symptoms do not improve. If you have high blood pressure, check your blood pressure regularly. Ask your health care professional what your blood pressure should be, and when you should contact them. You may get drowsy or dizzy. Do not drive, use machinery, or do anything that needs mental alertness until you know how this medicine affects you. Do not stand or sit up quickly, especially if you are an older patient. This reduces the risk of dizzy or fainting spells. Alcohol may interfere with the effect of this medicine. Avoid alcoholic drinks. The tablet shell for some brands of this medicine does not dissolve. This is normal. The tablet shell may appear whole in the stool. This is not a cause for concern. What side effects may I notice from receiving this medicine? Side effects that you should report to  your doctor or health care professional as soon as possible: -allergic reactions like skin rash or hives, swelling of the  face, lips, or tongue -difficulty urinating -dizzy or confused -fast heartbeat or chest pain -infection or fever -nausea or vomiting -nervousness or difficulty sleeping -rise in blood pressure -tremor or muscle contractions -trouble breathing Side effects that usually do not require medical attention (report to your doctor or health care professional if they continue or are bothersome): -back pain -dry mouth -headache -loss of appetite This list may not describe all possible side effects. Call your doctor for medical advice about side effects. You may report side effects to FDA at 1-800-FDA-1088. Where should I keep my medicine? Keep out of the reach of children. Store at room temperature between 20 to 25 degrees C (68 and 77 degrees F). Protect from moisture. Throw away any unused medicine after the expiration date. NOTE: This sheet is a summary. It may not cover all possible information. If you have questions about this medicine, talk to your doctor, pharmacist, or health care provider.  2018 Elsevier/Gold Standard (2010-01-08 11:24:40)

## 2018-05-27 NOTE — Patient Instructions (Signed)
Medication Instructions:   NO CHANGE  Labwork:  Your physician recommends that you HAVE LAB WORK TODAY  Your physician recommends that you return for lab work in: 4 WEEKS PRIOR TO EATING  Follow-Up:  Your physician recommends that you schedule a follow-up appointment in: 3 MONTHS WITH DR Jens SomRENSHAW   If you need a refill on your cardiac medications before your next appointment, please call your pharmacy.

## 2018-05-27 NOTE — Progress Notes (Addendum)
New Patient-Hospital Follow Up-Establish Care  Subjective:    Patient ID: Jason Ewing, male    DOB: 1966-07-01, 52 y.o.   MRN: [id number]   Chief Complaint  Patient presents with  . Establish Care  . Hospitalization Follow-up   HPI Jason Ewing is a 52 year old male with a past medical history of Hypertension, CAD, and Asthma. He is here today for hospital follow up and to establish care.   Current Status: He is s/p: NSTEMI on 05/17/2018. He received heart cath and coronary bypass. He is doing well today with no complaints. He continues to follow up with Cardiologist.   He denies visual changes, chest pain, heart palpitations, and falls. He has occasionally headaches and dizziness with position changes.  Denies severe headaches, confusion, seizures, double vision, and blurred vision, nausea and vomiting.  He denies visual changes, chest pain, cough, shortness of breath, heart palpitations, and falls. He has occasionally headaches. He denies dizziness with position changes. Denies severe headaches, confusion, seizures, double vision, and blurred vision, nausea and vomiting.  He denies fevers, chills, fatigue, recent infections, weight loss, and night sweats. No reports of GI problems such as nausea, and constipation. He has no reports of blood in stools, dysuria and hematuria. No depression or anxiety reported. He denies pain today.   Past Medical History:  Diagnosis Date  . Asthma   . Coronary artery disease   . Hypertension     Family History  Problem Relation Age of Onset  . Hypertension Mother   . CAD Mother        ? had stent placed  . Hypertension Sister     Social History   Socioeconomic History  . Marital status: Single    Spouse name: Not on file  . Number of children: Not on file  . Years of education: Not on file  . Highest education level: Not on file  Occupational History  . Not on file  Social Needs  . Financial resource strain: Not hard at all  . Food  insecurity:    Worry: Never true    Inability: Never true  . Transportation needs:    Medical: No    Non-medical: No  Tobacco Use  . Smoking status: Never Smoker  . Smokeless tobacco: Never Used  Substance and Sexual Activity  . Alcohol use: Yes    Comment: OCCASIONAL  . Drug use: Never  . Sexual activity: Not on file  Lifestyle  . Physical activity:    Days per week: Patient refused    Minutes per session: Patient refused  . Stress: Patient refused  Relationships  . Social connections:    Talks on phone: Patient refused    Gets together: Patient refused    Attends religious service: Patient refused    Active member of club or organization: Patient refused    Attends meetings of clubs or organizations: Patient refused    Relationship status: Patient refused  . Intimate partner violence:    Fear of current or ex partner: No    Emotionally abused: No    Physically abused: No    Forced sexual activity: No  Other Topics Concern  . Not on file  Social History Narrative  . Not on file    Past Surgical History:  Procedure Laterality Date  . CORONARY STENT INTERVENTION  05/18/2018   drug-eluting stent  . CORONARY STENT INTERVENTION N/A 05/18/2018   Procedure: CORONARY STENT INTERVENTION;  Surgeon: Runell Gess, MD;  Location: [facility]  INVASIVE CV LAB;  Service: Cardiovascular;  Laterality: N/A;  . LEFT HEART CATH AND CORONARY ANGIOGRAPHY N/A 05/18/2018   Procedure: LEFT HEART CATH AND CORONARY ANGIOGRAPHY;  Surgeon: Runell Gess, MD;  Location: [facility];  Service: Cardiovascular;  Laterality: N/A;    There is no immunization history on file for this patient.  Current Meds  Medication Sig  . aspirin 81 MG chewable tablet Chew 1 tablet (81 mg total) by mouth daily.  Marland Kitchen atorvastatin (LIPITOR) 80 MG tablet Take 1 tablet (80 mg total) by mouth daily at 6 PM.  . losartan (COZAAR) 50 MG tablet Take 1 tablet (50 mg total) by mouth daily.  . metoprolol succinate  (TOPROL-XL) 50 MG 24 hr tablet Take 1 tablet (50 mg total) by mouth daily. Take with or immediately following a meal.  . nitroGLYCERIN (NITROSTAT) 0.4 MG SL tablet Place 1 tablet (0.4 mg total) under the tongue every 5 (five) minutes as needed for chest pain.  Marland Kitchen spironolactone (ALDACTONE) 25 MG tablet Take 0.5 tablets (12.5 mg total) by mouth daily.  . ticagrelor (BRILINTA) 90 MG TABS tablet Take 1 tablet (90 mg total) by mouth 2 (two) times daily.    No Known Allergies  BP 140/90 (BP Location: Right Arm, Patient Position: Sitting, Cuff Size: Large)   Pulse 70   Temp 98.5 F (36.9 C) (Oral)   Ht 6\' 3"  (1.905 m)   Wt 248 lb (112.5 kg)   SpO2 99%   BMI 31.00 kg/m   Review of Systems  Constitutional: Negative.   HENT: Negative.   Eyes: Negative.   Respiratory: Negative.   Cardiovascular: Negative.   Gastrointestinal: Positive for diarrhea.  Endocrine: Negative.   Genitourinary: Negative.   Musculoskeletal: Negative.   Skin: Negative.   Allergic/Immunologic: Negative.   Neurological: Negative.   Hematological: Negative.   Psychiatric/Behavioral: Negative.    Objective:   Physical Exam  Constitutional: He is oriented to person, place, and time. He appears well-developed and well-nourished.  HENT:  Head: Normocephalic and atraumatic.  Right Ear: External ear normal.  Left Ear: External ear normal.  Nose: Nose normal.  Mouth/Throat: Oropharynx is clear and moist.  Eyes: Pupils are equal, round, and reactive to light. Conjunctivae and EOM are normal.  Neck: Normal range of motion. Neck supple.  Cardiovascular: Normal rate, regular rhythm, normal heart sounds and intact distal pulses.  Pulmonary/Chest: Effort normal and breath sounds normal.  Abdominal: Bowel sounds are normal.  Musculoskeletal: Normal range of motion.  Neurological: He is alert and oriented to person, place, and time.  Skin: Skin is warm and dry. Capillary refill takes less than 2 seconds.  Psychiatric: He  has a normal mood and affect. His behavior is normal. Judgment and thought content normal.  Nursing note and vitals reviewed.  Assessment & Plan:   1. Hospital discharge follow-up  2. Encounter to establish care - POCT urinalysis dipstick  3. NSTEMI (non-ST elevated myocardial infarction) (HCC) Stable. He has follow up appointment today with Cardiologist.   4. Hypertension, unspecified type Blood pressure is stable today at 140/90. He will continue Losartan, Metoprolol, Brilinata, and Spironolactone. He will continue to decrease high sodium intake, excessive alcohol intake, increase potassium intake, smoking cessation, and increase physical activity of at least 30 minutes of cardio activity daily. He will continue to follow Heart Healthy or DASH diet.  5. Hyperlipidemia, unspecified hyperlipidemia type Stable. He will continue to follow a low-fat diet, exercise daily with at least 30 minutes of  cardio.   6. Environmental and seasonal allergies He usually takes Allegra D for seasonal allergies. Rx to pharmacy today.  - fexofenadine-pseudoephedrine (ALLEGRA-D ALLERGY & CONGESTION) 60-120 MG 12 hr tablet; Take 1 tablet by mouth 2 (two) times daily.  Dispense: 60 tablet; Refill: 3  7. Follow up He will follow up in 2 months.  We will review Healthcare Maintenance tasks.   Meds ordered this encounter  Medications  . fexofenadine-pseudoephedrine (ALLEGRA-D ALLERGY & CONGESTION) 60-120 MG 12 hr tablet    Sig: Take 1 tablet by mouth 2 (two) times daily.    Dispense:  60 tablet    Refill:  3   Raliegh IpNatalie Yassin Scales,  MSN, [facility] FacilityFNP-C Patient [facility] [address]  [place], [place] [id number] [phone]

## 2018-05-27 NOTE — Addendum Note (Signed)
Addended by: Kallie LocksSTROUD, Diana Armijo M on: 05/27/2018 04:39 PM   Modules accepted: Level of Service

## 2018-05-28 ENCOUNTER — Encounter: Payer: Self-pay | Admitting: *Deleted

## 2018-05-28 LAB — BASIC METABOLIC PANEL
BUN/Creatinine Ratio: 13 (ref 9–20)
BUN: 16 mg/dL (ref 6–24)
CALCIUM: 9.5 mg/dL (ref 8.7–10.2)
CO2: 25 mmol/L (ref 20–29)
CREATININE: 1.21 mg/dL (ref 0.76–1.27)
Chloride: 102 mmol/L (ref 96–106)
GFR calc Af Amer: 79 mL/min/{1.73_m2} (ref >59)
GFR calc non Af Amer: 68 mL/min/{1.73_m2} (ref >59)
GLUCOSE: 90 mg/dL (ref 65–99)
Potassium: 4.3 mmol/L (ref 3.5–5.2)
Sodium: 142 mmol/L (ref 134–144)

## 2018-05-29 LAB — URINE CULTURE: Organism ID, Bacteria: NO GROWTH

## 2018-07-06 ENCOUNTER — Encounter: Payer: Self-pay | Admitting: *Deleted

## 2018-07-28 ENCOUNTER — Ambulatory Visit: Payer: Self-pay | Admitting: Family Medicine

## 2018-07-29 ENCOUNTER — Ambulatory Visit: Payer: Self-pay | Admitting: Cardiology

## 2018-08-11 NOTE — Progress Notes (Deleted)
HPI: Follow-up coronary artery disease.  Patient had a left heart catheterization August 2019 after ruling in for non-ST elevation myocardial infarction.  The LAD was occluded.  There is a 60% proximal and 60% mid RCA.  Ejection fraction 45 to 50%.  Patient had PCI of LAD.  Echocardiogram from August 2019 showed ejection fraction 45 to 50% and mild mitral regurgitation.  Since last seen,   Current Outpatient Medications  Medication Sig Dispense Refill  . aspirin 81 MG chewable tablet Chew 1 tablet (81 mg total) by mouth daily.    Marland Kitchen atorvastatin (LIPITOR) 80 MG tablet Take 1 tablet (80 mg total) by mouth daily at 6 PM. 30 tablet 5  . fexofenadine-pseudoephedrine (ALLEGRA-D ALLERGY & CONGESTION) 60-120 MG 12 hr tablet Take 1 tablet by mouth 2 (two) times daily. 60 tablet 3  . losartan (COZAAR) 50 MG tablet Take 1 tablet (50 mg total) by mouth daily. 30 tablet 5  . metoprolol succinate (TOPROL-XL) 50 MG 24 hr tablet Take 1 tablet (50 mg total) by mouth daily. Take with or immediately following a meal. 30 tablet 5  . nitroGLYCERIN (NITROSTAT) 0.4 MG SL tablet Place 1 tablet (0.4 mg total) under the tongue every 5 (five) minutes as needed for chest pain. 25 tablet 2  . spironolactone (ALDACTONE) 25 MG tablet Take 0.5 tablets (12.5 mg total) by mouth daily. 30 tablet 5  . ticagrelor (BRILINTA) 90 MG TABS tablet Take 1 tablet (90 mg total) by mouth 2 (two) times daily. 60 tablet 0   No current facility-administered medications for this visit.      Past Medical History:  Diagnosis Date  . Asthma   . Coronary artery disease   . Hypertension     Past Surgical History:  Procedure Laterality Date  . CORONARY STENT INTERVENTION  05/18/2018   drug-eluting stent  . CORONARY STENT INTERVENTION N/A 05/18/2018   Procedure: CORONARY STENT INTERVENTION;  Surgeon: Runell Gess, MD;  Location: [facility] INVASIVE CV [facility];  Service: Cardiovascular;  Laterality: N/A;  . LEFT HEART CATH AND CORONARY  ANGIOGRAPHY N/A 05/18/2018   Procedure: LEFT HEART CATH AND CORONARY ANGIOGRAPHY;  Surgeon: Runell Gess, MD;  Location: [facility] INVASIVE CV [facility];  Service: Cardiovascular;  Laterality: N/A;    Social History   Socioeconomic History  . Marital status: Single    Spouse name: Not on file  . Number of children: Not on file  . Years of education: Not on file  . Highest education level: Not on file  Occupational History  . Not on file  Social Needs  . Financial resource strain: Not hard at all  . Food insecurity:    Worry: Never true    Inability: Never true  . Transportation needs:    Medical: No    Non-medical: No  Tobacco Use  . Smoking status: Never Smoker  . Smokeless tobacco: Never Used  Substance and Sexual Activity  . Alcohol use: Yes    Comment: OCCASIONAL  . Drug use: Never  . Sexual activity: Not on file  Lifestyle  . Physical activity:    Days per week: Patient refused    Minutes per session: Patient refused  . Stress: Patient refused  Relationships  . Social connections:    Talks on phone: Patient refused    Gets together: Patient refused    Attends religious service: Patient refused    Active member of club or organization: Patient refused    Attends meetings of  clubs or organizations: Patient refused    Relationship status: Patient refused  . Intimate partner violence:    Fear of current or ex partner: No    Emotionally abused: No    Physically abused: No    Forced sexual activity: No  Other Topics Concern  . Not on file  Social History Narrative  . Not on file    Family History  Problem Relation Age of Onset  . Hypertension Mother   . CAD Mother        ? had stent placed  . Hypertension Sister     ROS: no fevers or chills, productive cough, hemoptysis, dysphasia, odynophagia, melena, hematochezia, dysuria, hematuria, rash, seizure activity, orthopnea, PND, pedal edema, claudication. Remaining systems are negative.  Physical  Exam: Well-developed well-nourished in no acute distress.  Skin is warm and dry.  HEENT is normal.  Neck is supple.  Chest is clear to auscultation with normal expansion.  Cardiovascular exam is regular rate and rhythm.  Abdominal exam nontender or distended. No masses palpated. Extremities show no edema. neuro grossly intact  ECG- personally reviewed  A/P  1 coronary artery disease-patient with history of PCI of LAD.  No chest pain.  Continue aspirin, Brilinta and statin.  2 ischemic cardiomyopathy-continue ARB and beta-blocker.  No evidence of CHF.  3 hypertension-blood pressure is controlled.  Continue present medications and follow.  4 hyperlipidemia-continue statin.  Check lipids and liver.  Olga MillersBrian Ric Rosenberg, MD

## 2018-08-19 ENCOUNTER — Ambulatory Visit: Payer: Self-pay | Admitting: Cardiology

## 2018-08-19 DIAGNOSIS — R0989 Other specified symptoms and signs involving the circulatory and respiratory systems: Secondary | ICD-10-CM

## 2019-11-18 IMAGING — CT CT ANGIO CHEST
2 of 6 series · 18 of 46 positions shown · IV contrast (ISOVUE)
Comparison: Chest radiograph May 17, 2018

CLINICAL DATA: Chest pain

EXAM:
CT ANGIOGRAPHY CHEST WITH CONTRAST
TECHNIQUE: Multidetector CT imaging of the chest was performed using the
standard protocol during bolus administration of intravenous
contrast. Multiplanar CT image reconstructions and MIPs were
obtained to evaluate the vascular anatomy.
CONTRAST:  95 mL 8HEXI1-GQO IOPAMIDOL (8HEXI1-GQO) INJECTION 76%

[Series 5: thins · axial · 0.74mm/px · z∈[+1283,+1577]mm · 15 of 322 slices shown]
[im 14/322  lung]
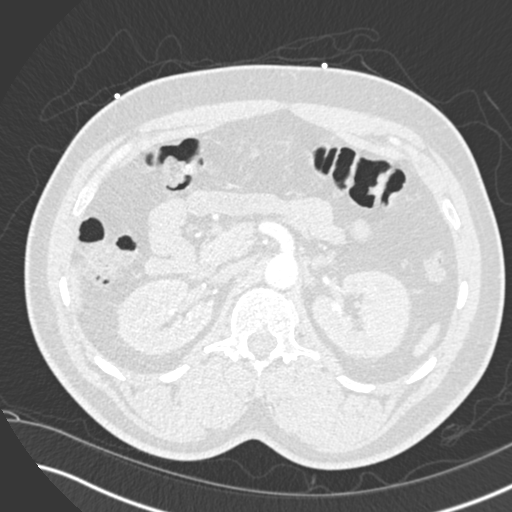
[im 42/322  soft-tissue]
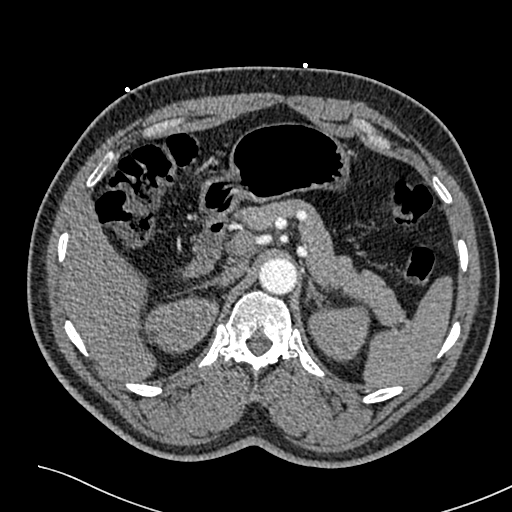
[im 56/322  lung]
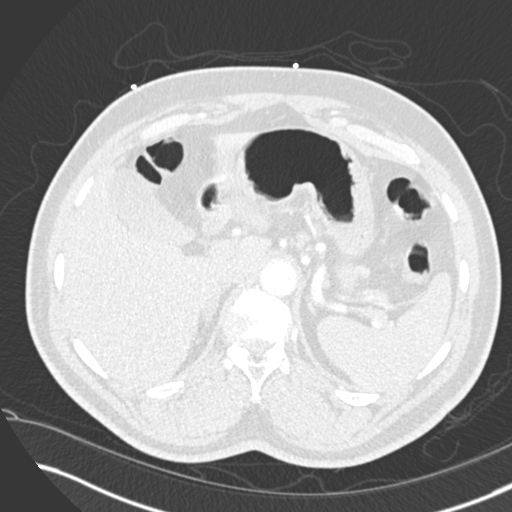
[im 84/322  soft-tissue]
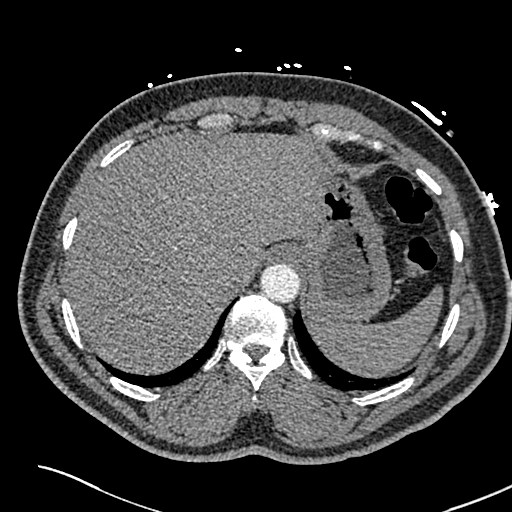
[im 98/322  lung]
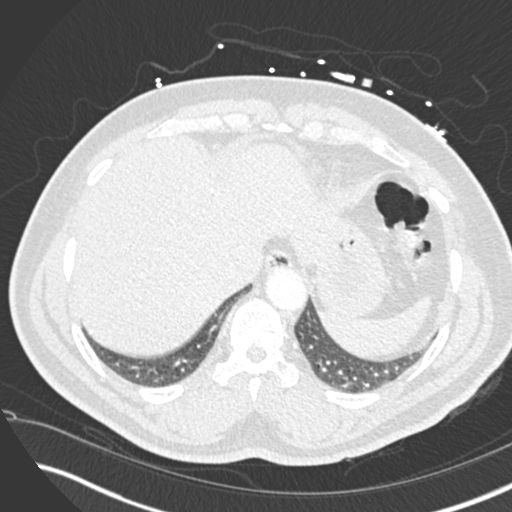
[im 126/322  soft-tissue]
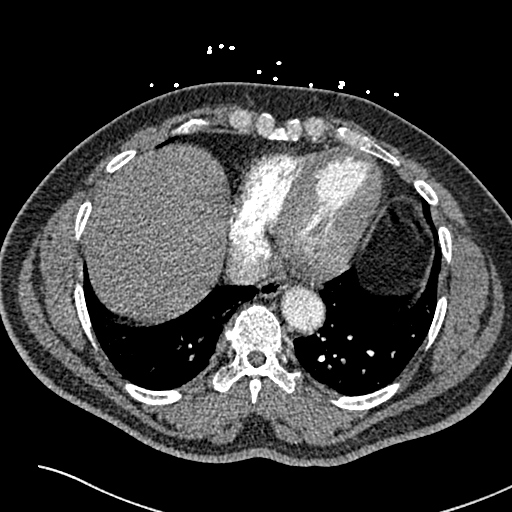
[im 140/322  lung]
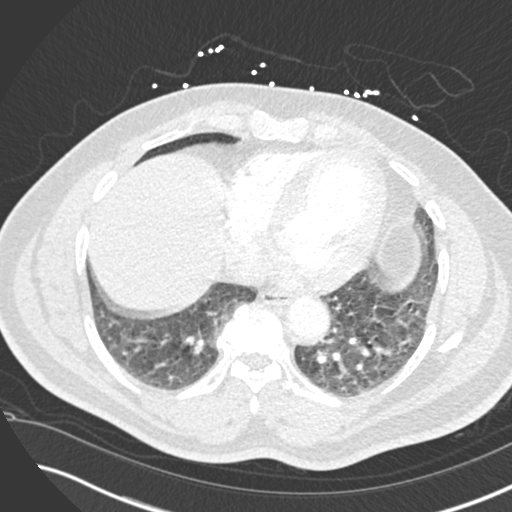
[im 168/322  soft-tissue]
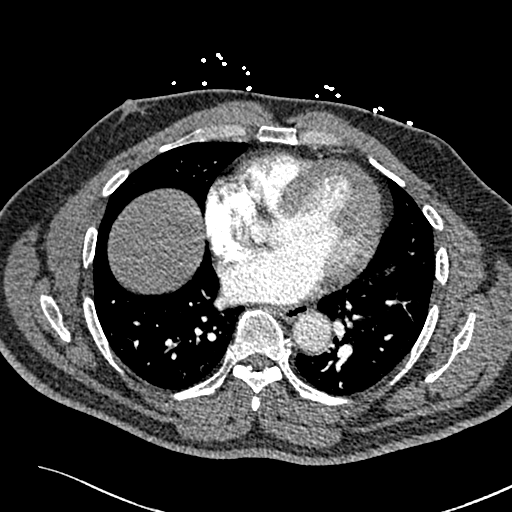
[im 182/322  lung]
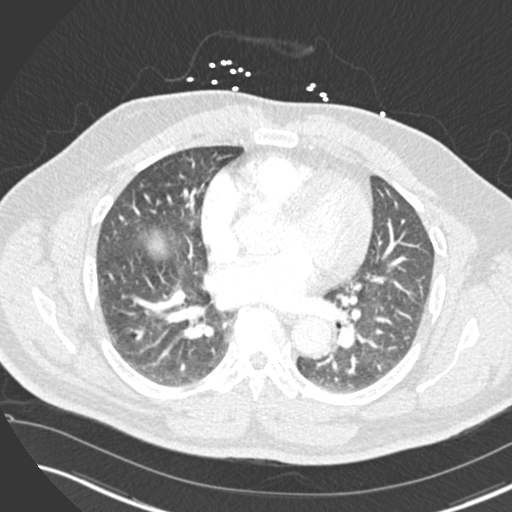
[im 196/322  soft-tissue]
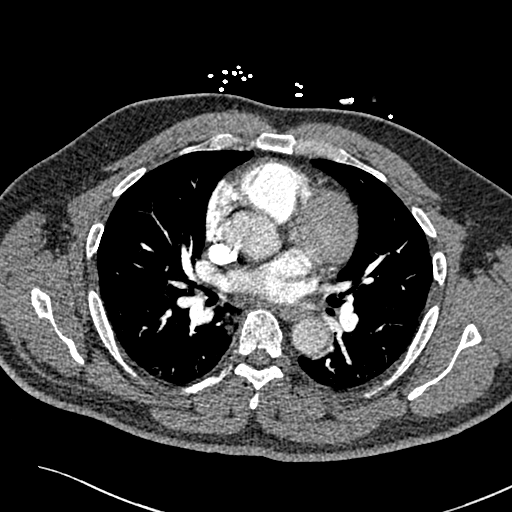
[im 224/322  lung]
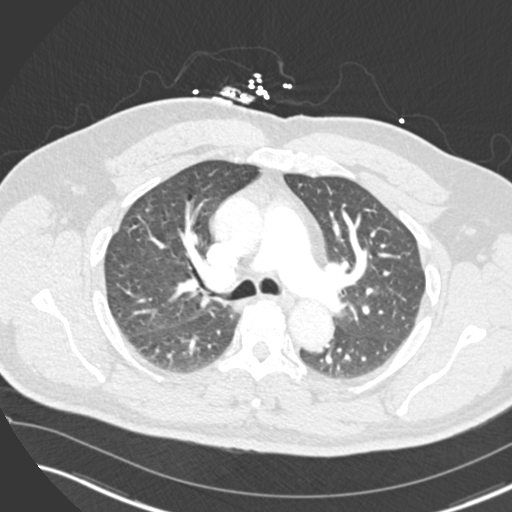
[im 238/322  soft-tissue]
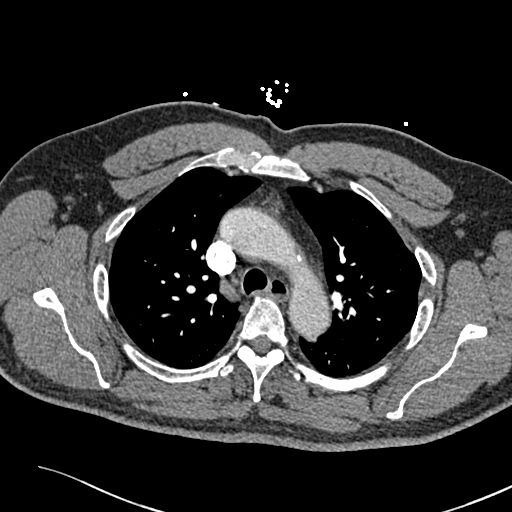
[im 266/322  lung]
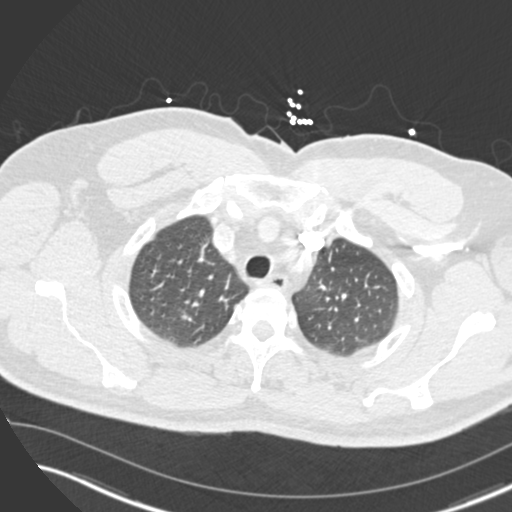
[im 280/322  soft-tissue]
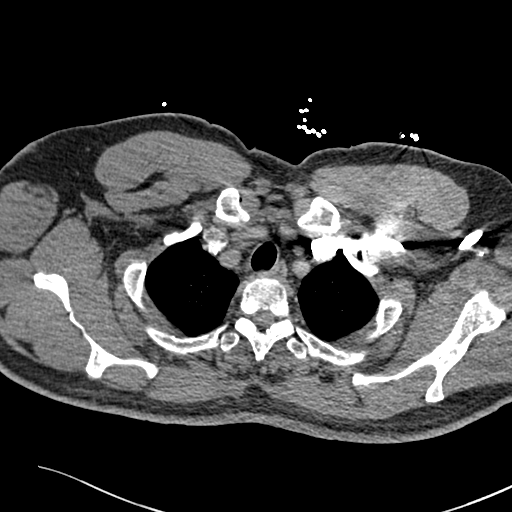
[im 308/322  lung]
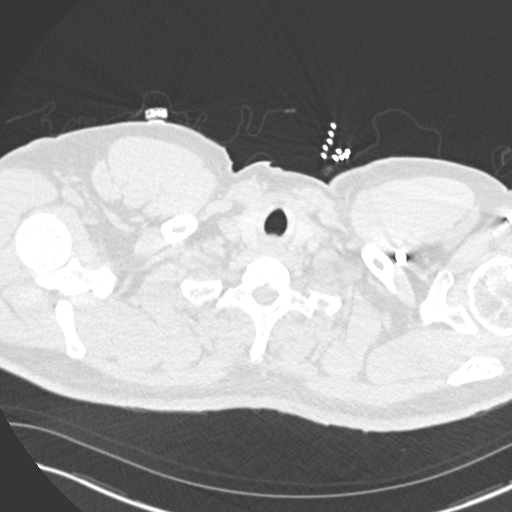

[Series 7: coronal mpr · coronal · 0.64mm/px · 3 of 151 slices shown]
[im 38/151  soft-tissue]
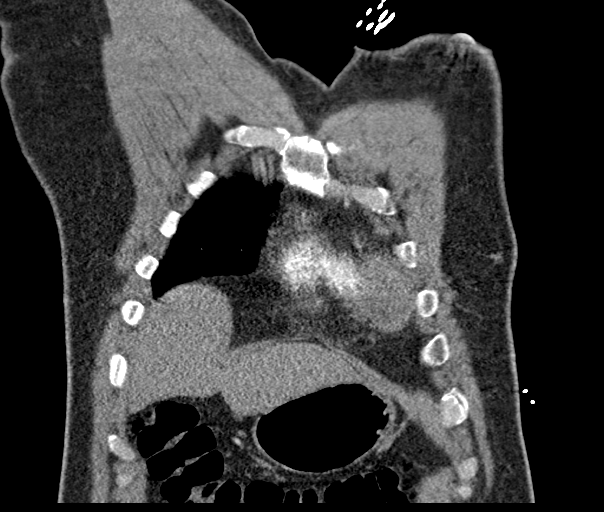
[im 76/151  soft-tissue]
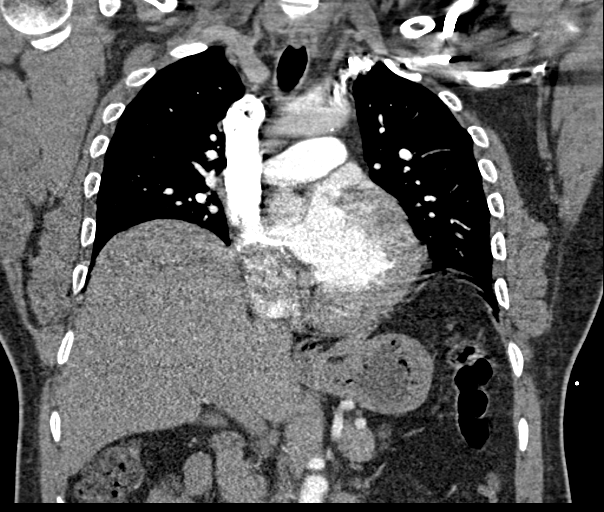
[im 113/151  soft-tissue]
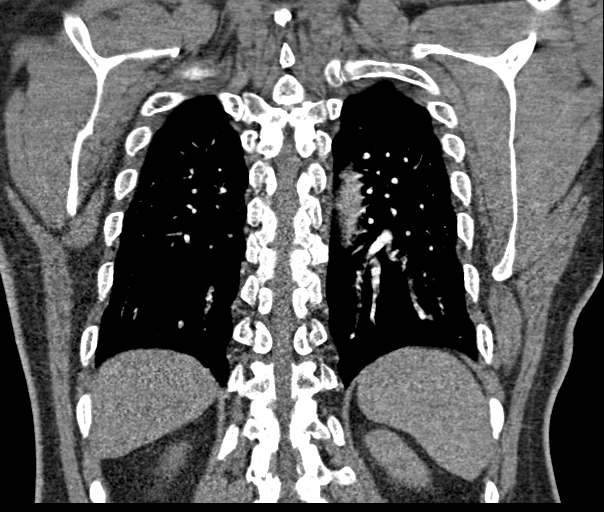

[18 of 46 positions shown; findings below may reference images not displayed]

FINDINGS: Cardiovascular: There is no demonstrable pulmonary embolus. There is
no thoracic aortic aneurysm or dissection. The visualized great
vessels appear normal. There is no pericardial effusion or
pericardial thickening.

The main pulmonary outflow tract measures 3.2 cm, prominent.

Mediastinum/Nodes: Visualized thyroid appears unremarkable. There is
no appreciable thoracic adenopathy. No esophageal lesions are
evident.

Lungs/Pleura: The lungs are clear. No pleural effusion or pleural
thickening evident.

Upper Abdomen: Visualized upper abdominal structures appear
unremarkable.

Musculoskeletal: There are no blastic or lytic bone lesions. No
chest wall lesions are evident.

Review of the MIP images confirms the above findings.
IMPRESSION: 1. No demonstrable pulmonary embolus. No thoracic aortic aneurysm or
dissection.

2. Prominence the main pulmonary outflow tract, a finding felt to be
indicative of a degree of pulmonary arterial hypertension.

3.  No edema or consolidation.

4.  No evident thoracic adenopathy.
